# Patient Record
Sex: Female | Born: 1940 | Race: White | Hispanic: No | Smoking: Former smoker
Health system: Southern US, Community
[De-identification: ages and names within clinical notes are randomized; demographics above are authoritative.]

## PROBLEM LIST (undated history)

## (undated) DIAGNOSIS — R87629 Unspecified abnormal cytological findings in specimens from vagina: Secondary | ICD-10-CM

## (undated) DIAGNOSIS — F419 Anxiety disorder, unspecified: Secondary | ICD-10-CM

## (undated) DIAGNOSIS — I10 Essential (primary) hypertension: Secondary | ICD-10-CM

## (undated) DIAGNOSIS — F329 Major depressive disorder, single episode, unspecified: Secondary | ICD-10-CM

## (undated) DIAGNOSIS — IMO0002 Reserved for concepts with insufficient information to code with codable children: Secondary | ICD-10-CM

## (undated) DIAGNOSIS — N816 Rectocele: Secondary | ICD-10-CM

## (undated) DIAGNOSIS — E0590 Thyrotoxicosis, unspecified without thyrotoxic crisis or storm: Secondary | ICD-10-CM

## (undated) DIAGNOSIS — E7800 Pure hypercholesterolemia, unspecified: Secondary | ICD-10-CM

## (undated) DIAGNOSIS — E079 Disorder of thyroid, unspecified: Secondary | ICD-10-CM

## (undated) DIAGNOSIS — R87619 Unspecified abnormal cytological findings in specimens from cervix uteri: Secondary | ICD-10-CM

## (undated) DIAGNOSIS — N3941 Urge incontinence: Secondary | ICD-10-CM

## (undated) DIAGNOSIS — C801 Malignant (primary) neoplasm, unspecified: Secondary | ICD-10-CM

## (undated) DIAGNOSIS — F32A Depression, unspecified: Secondary | ICD-10-CM

## (undated) HISTORY — DX: Urge incontinence: N39.41

## (undated) HISTORY — DX: Major depressive disorder, single episode, unspecified: F32.9

## (undated) HISTORY — PX: ABDOMINAL HYSTERECTOMY: SHX81

## (undated) HISTORY — DX: Disorder of thyroid, unspecified: E07.9

## (undated) HISTORY — DX: Malignant (primary) neoplasm, unspecified: C80.1

## (undated) HISTORY — DX: Anxiety disorder, unspecified: F41.9

## (undated) HISTORY — DX: Reserved for concepts with insufficient information to code with codable children: IMO0002

## (undated) HISTORY — DX: Rectocele: N81.6

## (undated) HISTORY — DX: Thyrotoxicosis, unspecified without thyrotoxic crisis or storm: E05.90

## (undated) HISTORY — DX: Depression, unspecified: F32.A

## (undated) HISTORY — DX: Pure hypercholesterolemia, unspecified: E78.00

## (undated) HISTORY — DX: Essential (primary) hypertension: I10

## (undated) HISTORY — PX: EYE SURGERY: SHX253

## (undated) HISTORY — DX: Unspecified abnormal cytological findings in specimens from cervix uteri: R87.619

## (undated) HISTORY — PX: OTHER SURGICAL HISTORY: SHX169

## (undated) HISTORY — PX: BLADDER SURGERY: SHX569

## (undated) HISTORY — DX: Unspecified abnormal cytological findings in specimens from vagina: R87.629

---

## 2012-03-22 ENCOUNTER — Other Ambulatory Visit: Payer: Self-pay | Admitting: Internal Medicine

## 2012-03-22 DIAGNOSIS — Z1231 Encounter for screening mammogram for malignant neoplasm of breast: Secondary | ICD-10-CM

## 2012-04-13 ENCOUNTER — Ambulatory Visit: Payer: Self-pay

## 2012-11-12 ENCOUNTER — Ambulatory Visit (INDEPENDENT_AMBULATORY_CARE_PROVIDER_SITE_OTHER): Payer: Self-pay | Admitting: Adult Health

## 2012-11-12 ENCOUNTER — Encounter: Payer: Self-pay | Admitting: Adult Health

## 2012-11-12 VITALS — BP 112/70 | HR 72 | Ht 59.5 in | Wt 132.0 lb

## 2012-11-12 DIAGNOSIS — I10 Essential (primary) hypertension: Secondary | ICD-10-CM | POA: Insufficient documentation

## 2012-11-12 DIAGNOSIS — E7800 Pure hypercholesterolemia, unspecified: Secondary | ICD-10-CM

## 2012-11-12 DIAGNOSIS — E0590 Thyrotoxicosis, unspecified without thyrotoxic crisis or storm: Secondary | ICD-10-CM | POA: Insufficient documentation

## 2012-11-12 DIAGNOSIS — N3941 Urge incontinence: Secondary | ICD-10-CM

## 2012-11-12 DIAGNOSIS — Z01419 Encounter for gynecological examination (general) (routine) without abnormal findings: Secondary | ICD-10-CM

## 2012-11-12 DIAGNOSIS — Z1212 Encounter for screening for malignant neoplasm of rectum: Secondary | ICD-10-CM

## 2012-11-12 DIAGNOSIS — F419 Anxiety disorder, unspecified: Secondary | ICD-10-CM | POA: Insufficient documentation

## 2012-11-12 DIAGNOSIS — N816 Rectocele: Secondary | ICD-10-CM

## 2012-11-12 DIAGNOSIS — F411 Generalized anxiety disorder: Secondary | ICD-10-CM

## 2012-11-12 HISTORY — DX: Pure hypercholesterolemia, unspecified: E78.00

## 2012-11-12 HISTORY — DX: Rectocele: N81.6

## 2012-11-12 HISTORY — DX: Thyrotoxicosis, unspecified without thyrotoxic crisis or storm: E05.90

## 2012-11-12 HISTORY — DX: Urge incontinence: N39.41

## 2012-11-12 LAB — HEMOCCULT GUIAC POC 1CARD (OFFICE)

## 2012-11-12 MED ORDER — ESTRADIOL 0.1 MG/GM VA CREA: TOPICAL_CREAM | VAGINAL | Status: DC

## 2012-11-12 NOTE — Patient Instructions (Signed)
Use estace cream as needed, get mammogram, labs per DrMarland Kitchen Margo Aye, sign up for my chart.

## 2012-11-12 NOTE — Progress Notes (Signed)
Patient ID: Cindy Hopkins, female   DOB: [birth year], 72 y.o.   MRN: [id number] History of Present Illness:Cindy Hopkins is a 72 year old white female, married in today for Physical exam.    Current Medications, Allergies, Past Medical History, Past Surgical History, Family History and Social History were reviewed in [employer] record.     Review of Systems:Patient denies any headaches, blurred vision, shortness of breath, chest pain, abdominal pain, problems with bowel movements, urination, except occasional urge incontinence.She is not sexually active. She has some heartburn and uses OTC medication, she has had tests and x-rays in the past.she has aches in her knees and  Back, she says it is arthritis.She is anxious at times(she cares for her husband), and she is taking prozac.      Physical Exam: General:  Well developed, well nourished, no acute distress Skin:  Warm and dry Neck:  Midline trachea, normal thyroid, no carotid bruits heard. Lungs; Clear to auscultation bilaterally Breast:  No dominant palpable mass, retraction, or nipple discharge Cardiovascular: Regular rate and rhythm Abdomen:  Soft, non tender, no hepatosplenomegaly Pelvic:  External genitalia is normal in appearance for age.  The vagina is atrophic in appearance.  The cervix  and uterus are absent No adnexal masses or tenderness noted. Rectal: Good sphincter tone, no polyps, or hemorrhoids felt.  Hemoccult negative.Positive rectocele Extremities:  No swelling or varicosities noted Psych:  Anxious.   Impression:Yearly exam Hypertension Hyperthyroid Rectocele Urge incontinence     Plan:Mammogram advised, colonoscopy as per GI, Labs as per Dr. Margo Aye, Samples given of estrace cream Return to clinic in 1-2 years physical exam or prn problems.

## 2014-05-18 ENCOUNTER — Other Ambulatory Visit (HOSPITAL_COMMUNITY): Payer: Self-pay | Admitting: Internal Medicine

## 2014-05-18 DIAGNOSIS — Z139 Encounter for screening, unspecified: Secondary | ICD-10-CM

## 2014-05-24 ENCOUNTER — Ambulatory Visit (HOSPITAL_COMMUNITY)
Admission: RE | Admit: 2014-05-24 | Discharge: 2014-05-24 | Disposition: A | Discharge status: Home / Self Care | Payer: Medicare PPO | Source: Ambulatory Visit | Attending: Internal Medicine | Admitting: Internal Medicine

## 2014-05-24 DIAGNOSIS — Z1231 Encounter for screening mammogram for malignant neoplasm of breast: Secondary | ICD-10-CM | POA: Diagnosis not present

## 2014-05-24 DIAGNOSIS — Z139 Encounter for screening, unspecified: Secondary | ICD-10-CM

## 2014-06-19 ENCOUNTER — Encounter: Payer: Self-pay | Admitting: Adult Health

## 2014-10-04 DIAGNOSIS — E782 Mixed hyperlipidemia: Secondary | ICD-10-CM | POA: Diagnosis not present

## 2014-10-04 DIAGNOSIS — I10 Essential (primary) hypertension: Secondary | ICD-10-CM | POA: Diagnosis not present

## 2014-10-04 DIAGNOSIS — E039 Hypothyroidism, unspecified: Secondary | ICD-10-CM | POA: Diagnosis not present

## 2014-10-05 DIAGNOSIS — R42 Dizziness and giddiness: Secondary | ICD-10-CM | POA: Diagnosis not present

## 2014-10-05 DIAGNOSIS — F339 Major depressive disorder, recurrent, unspecified: Secondary | ICD-10-CM | POA: Diagnosis not present

## 2014-10-05 DIAGNOSIS — M791 Myalgia: Secondary | ICD-10-CM | POA: Diagnosis not present

## 2014-10-05 DIAGNOSIS — I10 Essential (primary) hypertension: Secondary | ICD-10-CM | POA: Diagnosis not present

## 2015-01-16 DIAGNOSIS — M79642 Pain in left hand: Secondary | ICD-10-CM | POA: Diagnosis not present

## 2015-01-16 DIAGNOSIS — G8929 Other chronic pain: Secondary | ICD-10-CM | POA: Diagnosis not present

## 2015-01-16 DIAGNOSIS — M1990 Unspecified osteoarthritis, unspecified site: Secondary | ICD-10-CM | POA: Diagnosis not present

## 2015-01-16 DIAGNOSIS — M25561 Pain in right knee: Secondary | ICD-10-CM | POA: Diagnosis not present

## 2015-01-16 DIAGNOSIS — M79641 Pain in right hand: Secondary | ICD-10-CM | POA: Diagnosis not present

## 2015-01-16 DIAGNOSIS — M25562 Pain in left knee: Secondary | ICD-10-CM | POA: Diagnosis not present

## 2015-01-31 DIAGNOSIS — M150 Primary generalized (osteo)arthritis: Secondary | ICD-10-CM | POA: Diagnosis not present

## 2015-01-31 DIAGNOSIS — M65341 Trigger finger, right ring finger: Secondary | ICD-10-CM | POA: Diagnosis not present

## 2015-04-03 DIAGNOSIS — I10 Essential (primary) hypertension: Secondary | ICD-10-CM | POA: Diagnosis not present

## 2015-04-03 DIAGNOSIS — E782 Mixed hyperlipidemia: Secondary | ICD-10-CM | POA: Diagnosis not present

## 2015-04-03 DIAGNOSIS — E039 Hypothyroidism, unspecified: Secondary | ICD-10-CM | POA: Diagnosis not present

## 2015-04-05 DIAGNOSIS — M79606 Pain in leg, unspecified: Secondary | ICD-10-CM | POA: Diagnosis not present

## 2015-04-05 DIAGNOSIS — E785 Hyperlipidemia, unspecified: Secondary | ICD-10-CM | POA: Diagnosis not present

## 2015-04-05 DIAGNOSIS — I519 Heart disease, unspecified: Secondary | ICD-10-CM | POA: Diagnosis not present

## 2015-04-05 DIAGNOSIS — K219 Gastro-esophageal reflux disease without esophagitis: Secondary | ICD-10-CM | POA: Diagnosis not present

## 2015-04-05 DIAGNOSIS — I10 Essential (primary) hypertension: Secondary | ICD-10-CM | POA: Diagnosis not present

## 2015-04-05 DIAGNOSIS — M6530 Trigger finger, unspecified finger: Secondary | ICD-10-CM | POA: Diagnosis not present

## 2015-04-16 ENCOUNTER — Other Ambulatory Visit (HOSPITAL_COMMUNITY): Payer: Self-pay | Admitting: Internal Medicine

## 2015-04-16 DIAGNOSIS — I25119 Atherosclerotic heart disease of native coronary artery with unspecified angina pectoris: Secondary | ICD-10-CM

## 2015-04-19 ENCOUNTER — Ambulatory Visit (HOSPITAL_COMMUNITY)
Admission: RE | Admit: 2015-04-19 | Discharge: 2015-04-19 | Disposition: A | Discharge status: Home / Self Care | Payer: Commercial Managed Care - HMO | Source: Ambulatory Visit | Attending: Internal Medicine | Admitting: Internal Medicine

## 2015-04-19 DIAGNOSIS — I2510 Atherosclerotic heart disease of native coronary artery without angina pectoris: Secondary | ICD-10-CM | POA: Diagnosis not present

## 2015-04-19 DIAGNOSIS — I6523 Occlusion and stenosis of bilateral carotid arteries: Secondary | ICD-10-CM | POA: Diagnosis not present

## 2015-04-19 DIAGNOSIS — I25119 Atherosclerotic heart disease of native coronary artery with unspecified angina pectoris: Secondary | ICD-10-CM

## 2015-06-25 DIAGNOSIS — H524 Presbyopia: Secondary | ICD-10-CM | POA: Diagnosis not present

## 2015-06-25 DIAGNOSIS — H2510 Age-related nuclear cataract, unspecified eye: Secondary | ICD-10-CM | POA: Diagnosis not present

## 2015-06-25 DIAGNOSIS — H5210 Myopia, unspecified eye: Secondary | ICD-10-CM | POA: Diagnosis not present

## 2015-07-25 ENCOUNTER — Other Ambulatory Visit (HOSPITAL_COMMUNITY): Payer: Self-pay | Admitting: Internal Medicine

## 2015-07-25 DIAGNOSIS — Z1231 Encounter for screening mammogram for malignant neoplasm of breast: Secondary | ICD-10-CM

## 2015-07-30 ENCOUNTER — Ambulatory Visit (HOSPITAL_COMMUNITY)
Admission: RE | Admit: 2015-07-30 | Discharge: 2015-07-30 | Disposition: A | Discharge status: Home / Self Care | Payer: Commercial Managed Care - HMO | Source: Ambulatory Visit | Attending: Internal Medicine | Admitting: Internal Medicine

## 2015-07-30 ENCOUNTER — Ambulatory Visit (HOSPITAL_COMMUNITY): Payer: Medicare PPO

## 2015-07-30 DIAGNOSIS — Z1231 Encounter for screening mammogram for malignant neoplasm of breast: Secondary | ICD-10-CM | POA: Diagnosis not present

## 2015-09-25 DIAGNOSIS — E039 Hypothyroidism, unspecified: Secondary | ICD-10-CM | POA: Diagnosis not present

## 2015-09-25 DIAGNOSIS — I519 Heart disease, unspecified: Secondary | ICD-10-CM | POA: Diagnosis not present

## 2015-09-25 DIAGNOSIS — E782 Mixed hyperlipidemia: Secondary | ICD-10-CM | POA: Diagnosis not present

## 2015-09-28 DIAGNOSIS — H02403 Unspecified ptosis of bilateral eyelids: Secondary | ICD-10-CM | POA: Diagnosis not present

## 2015-10-08 DIAGNOSIS — L570 Actinic keratosis: Secondary | ICD-10-CM | POA: Diagnosis not present

## 2015-10-08 DIAGNOSIS — Z08 Encounter for follow-up examination after completed treatment for malignant neoplasm: Secondary | ICD-10-CM | POA: Diagnosis not present

## 2015-10-08 DIAGNOSIS — D225 Melanocytic nevi of trunk: Secondary | ICD-10-CM | POA: Diagnosis not present

## 2015-10-08 DIAGNOSIS — X32XXXD Exposure to sunlight, subsequent encounter: Secondary | ICD-10-CM | POA: Diagnosis not present

## 2015-10-08 DIAGNOSIS — Z85828 Personal history of other malignant neoplasm of skin: Secondary | ICD-10-CM | POA: Diagnosis not present

## 2015-11-08 ENCOUNTER — Ambulatory Visit (INDEPENDENT_AMBULATORY_CARE_PROVIDER_SITE_OTHER): Payer: Commercial Managed Care - HMO | Admitting: Otolaryngology

## 2015-12-07 DIAGNOSIS — H02836 Dermatochalasis of left eye, unspecified eyelid: Secondary | ICD-10-CM | POA: Diagnosis not present

## 2015-12-07 DIAGNOSIS — H02401 Unspecified ptosis of right eyelid: Secondary | ICD-10-CM | POA: Diagnosis not present

## 2015-12-07 DIAGNOSIS — H02833 Dermatochalasis of right eye, unspecified eyelid: Secondary | ICD-10-CM | POA: Diagnosis not present

## 2015-12-07 DIAGNOSIS — H5702 Anisocoria: Secondary | ICD-10-CM | POA: Diagnosis not present

## 2015-12-27 ENCOUNTER — Ambulatory Visit (INDEPENDENT_AMBULATORY_CARE_PROVIDER_SITE_OTHER): Payer: Commercial Managed Care - HMO | Admitting: Otolaryngology

## 2015-12-27 DIAGNOSIS — H903 Sensorineural hearing loss, bilateral: Secondary | ICD-10-CM

## 2015-12-27 DIAGNOSIS — H6983 Other specified disorders of Eustachian tube, bilateral: Secondary | ICD-10-CM | POA: Diagnosis not present

## 2015-12-27 DIAGNOSIS — H6123 Impacted cerumen, bilateral: Secondary | ICD-10-CM | POA: Diagnosis not present

## 2016-01-01 DIAGNOSIS — G902 Horner's syndrome: Secondary | ICD-10-CM | POA: Diagnosis not present

## 2016-01-07 DIAGNOSIS — M4312 Spondylolisthesis, cervical region: Secondary | ICD-10-CM | POA: Diagnosis not present

## 2016-01-07 DIAGNOSIS — R590 Localized enlarged lymph nodes: Secondary | ICD-10-CM | POA: Diagnosis not present

## 2016-01-07 DIAGNOSIS — M50821 Other cervical disc disorders at C4-C5 level: Secondary | ICD-10-CM | POA: Diagnosis not present

## 2016-01-07 DIAGNOSIS — M50822 Other cervical disc disorders at C5-C6 level: Secondary | ICD-10-CM | POA: Diagnosis not present

## 2016-01-07 DIAGNOSIS — M2578 Osteophyte, vertebrae: Secondary | ICD-10-CM | POA: Diagnosis not present

## 2016-01-07 DIAGNOSIS — I771 Stricture of artery: Secondary | ICD-10-CM | POA: Diagnosis not present

## 2016-01-07 DIAGNOSIS — I6522 Occlusion and stenosis of left carotid artery: Secondary | ICD-10-CM | POA: Diagnosis not present

## 2016-01-07 DIAGNOSIS — G902 Horner's syndrome: Secondary | ICD-10-CM | POA: Diagnosis not present

## 2016-01-07 DIAGNOSIS — M47812 Spondylosis without myelopathy or radiculopathy, cervical region: Secondary | ICD-10-CM | POA: Diagnosis not present

## 2016-01-17 DIAGNOSIS — H02403 Unspecified ptosis of bilateral eyelids: Secondary | ICD-10-CM | POA: Diagnosis not present

## 2016-02-11 DIAGNOSIS — I10 Essential (primary) hypertension: Secondary | ICD-10-CM | POA: Diagnosis not present

## 2016-02-11 DIAGNOSIS — E7800 Pure hypercholesterolemia, unspecified: Secondary | ICD-10-CM | POA: Diagnosis not present

## 2016-02-11 DIAGNOSIS — M1990 Unspecified osteoarthritis, unspecified site: Secondary | ICD-10-CM | POA: Diagnosis not present

## 2016-02-11 DIAGNOSIS — H02834 Dermatochalasis of left upper eyelid: Secondary | ICD-10-CM | POA: Diagnosis not present

## 2016-02-11 DIAGNOSIS — F418 Other specified anxiety disorders: Secondary | ICD-10-CM | POA: Diagnosis not present

## 2016-02-11 DIAGNOSIS — H02401 Unspecified ptosis of right eyelid: Secondary | ICD-10-CM | POA: Diagnosis not present

## 2016-02-11 DIAGNOSIS — Z85828 Personal history of other malignant neoplasm of skin: Secondary | ICD-10-CM | POA: Diagnosis not present

## 2016-02-11 DIAGNOSIS — E039 Hypothyroidism, unspecified: Secondary | ICD-10-CM | POA: Diagnosis not present

## 2016-02-11 DIAGNOSIS — K219 Gastro-esophageal reflux disease without esophagitis: Secondary | ICD-10-CM | POA: Diagnosis not present

## 2016-02-11 DIAGNOSIS — H02831 Dermatochalasis of right upper eyelid: Secondary | ICD-10-CM | POA: Diagnosis not present

## 2016-04-01 DIAGNOSIS — E039 Hypothyroidism, unspecified: Secondary | ICD-10-CM | POA: Diagnosis not present

## 2016-04-01 DIAGNOSIS — E782 Mixed hyperlipidemia: Secondary | ICD-10-CM | POA: Diagnosis not present

## 2016-04-03 DIAGNOSIS — I10 Essential (primary) hypertension: Secondary | ICD-10-CM | POA: Diagnosis not present

## 2016-04-03 DIAGNOSIS — F339 Major depressive disorder, recurrent, unspecified: Secondary | ICD-10-CM | POA: Diagnosis not present

## 2016-04-03 DIAGNOSIS — K210 Gastro-esophageal reflux disease with esophagitis: Secondary | ICD-10-CM | POA: Diagnosis not present

## 2016-04-03 DIAGNOSIS — E785 Hyperlipidemia, unspecified: Secondary | ICD-10-CM | POA: Diagnosis not present

## 2016-04-03 DIAGNOSIS — H02403 Unspecified ptosis of bilateral eyelids: Secondary | ICD-10-CM | POA: Diagnosis not present

## 2016-04-03 DIAGNOSIS — I519 Heart disease, unspecified: Secondary | ICD-10-CM | POA: Diagnosis not present

## 2016-05-27 ENCOUNTER — Ambulatory Visit: Payer: Commercial Managed Care - HMO

## 2016-09-02 DIAGNOSIS — H524 Presbyopia: Secondary | ICD-10-CM | POA: Diagnosis not present

## 2016-09-02 DIAGNOSIS — Z0100 Encounter for examination of eyes and vision without abnormal findings: Secondary | ICD-10-CM | POA: Diagnosis not present

## 2016-09-24 DIAGNOSIS — E039 Hypothyroidism, unspecified: Secondary | ICD-10-CM | POA: Diagnosis not present

## 2016-09-24 DIAGNOSIS — E785 Hyperlipidemia, unspecified: Secondary | ICD-10-CM | POA: Diagnosis not present

## 2016-09-26 DIAGNOSIS — E785 Hyperlipidemia, unspecified: Secondary | ICD-10-CM | POA: Diagnosis not present

## 2016-09-26 DIAGNOSIS — N952 Postmenopausal atrophic vaginitis: Secondary | ICD-10-CM | POA: Diagnosis not present

## 2016-09-26 DIAGNOSIS — I10 Essential (primary) hypertension: Secondary | ICD-10-CM | POA: Diagnosis not present

## 2016-09-26 DIAGNOSIS — F5101 Primary insomnia: Secondary | ICD-10-CM | POA: Diagnosis not present

## 2016-09-26 DIAGNOSIS — F339 Major depressive disorder, recurrent, unspecified: Secondary | ICD-10-CM | POA: Diagnosis not present

## 2016-09-26 DIAGNOSIS — M79606 Pain in leg, unspecified: Secondary | ICD-10-CM | POA: Diagnosis not present

## 2017-01-01 ENCOUNTER — Ambulatory Visit (INDEPENDENT_AMBULATORY_CARE_PROVIDER_SITE_OTHER): Payer: Medicare HMO | Admitting: Otolaryngology

## 2017-01-01 DIAGNOSIS — H903 Sensorineural hearing loss, bilateral: Secondary | ICD-10-CM | POA: Diagnosis not present

## 2017-01-01 DIAGNOSIS — H6983 Other specified disorders of Eustachian tube, bilateral: Secondary | ICD-10-CM | POA: Diagnosis not present

## 2017-01-01 DIAGNOSIS — H6121 Impacted cerumen, right ear: Secondary | ICD-10-CM

## 2017-03-19 DIAGNOSIS — I10 Essential (primary) hypertension: Secondary | ICD-10-CM | POA: Diagnosis not present

## 2017-03-19 DIAGNOSIS — E039 Hypothyroidism, unspecified: Secondary | ICD-10-CM | POA: Diagnosis not present

## 2017-03-23 DIAGNOSIS — E785 Hyperlipidemia, unspecified: Secondary | ICD-10-CM | POA: Diagnosis not present

## 2017-03-23 DIAGNOSIS — I10 Essential (primary) hypertension: Secondary | ICD-10-CM | POA: Diagnosis not present

## 2017-03-23 DIAGNOSIS — I519 Heart disease, unspecified: Secondary | ICD-10-CM | POA: Diagnosis not present

## 2017-03-23 DIAGNOSIS — N952 Postmenopausal atrophic vaginitis: Secondary | ICD-10-CM | POA: Diagnosis not present

## 2017-03-23 DIAGNOSIS — G4700 Insomnia, unspecified: Secondary | ICD-10-CM | POA: Diagnosis not present

## 2017-03-23 DIAGNOSIS — M79606 Pain in leg, unspecified: Secondary | ICD-10-CM | POA: Diagnosis not present

## 2017-03-23 DIAGNOSIS — F339 Major depressive disorder, recurrent, unspecified: Secondary | ICD-10-CM | POA: Diagnosis not present

## 2017-03-23 DIAGNOSIS — K210 Gastro-esophageal reflux disease with esophagitis: Secondary | ICD-10-CM | POA: Diagnosis not present

## 2017-03-23 DIAGNOSIS — Z6825 Body mass index (BMI) 25.0-25.9, adult: Secondary | ICD-10-CM | POA: Diagnosis not present

## 2017-08-27 DIAGNOSIS — H524 Presbyopia: Secondary | ICD-10-CM | POA: Diagnosis not present

## 2017-08-27 DIAGNOSIS — H25013 Cortical age-related cataract, bilateral: Secondary | ICD-10-CM | POA: Diagnosis not present

## 2017-09-03 ENCOUNTER — Other Ambulatory Visit (HOSPITAL_COMMUNITY): Payer: Self-pay | Admitting: Internal Medicine

## 2017-09-03 DIAGNOSIS — Z1231 Encounter for screening mammogram for malignant neoplasm of breast: Secondary | ICD-10-CM

## 2017-09-07 ENCOUNTER — Ambulatory Visit (HOSPITAL_COMMUNITY): Payer: Medicare HMO

## 2017-09-14 ENCOUNTER — Ambulatory Visit (HOSPITAL_COMMUNITY)
Admission: RE | Admit: 2017-09-14 | Discharge: 2017-09-14 | Disposition: A | Discharge status: Home / Self Care | Payer: Medicare HMO | Source: Ambulatory Visit | Attending: Internal Medicine | Admitting: Internal Medicine

## 2017-09-14 DIAGNOSIS — Z1231 Encounter for screening mammogram for malignant neoplasm of breast: Secondary | ICD-10-CM | POA: Insufficient documentation

## 2017-09-21 ENCOUNTER — Encounter: Payer: Self-pay | Admitting: Adult Health

## 2017-09-21 ENCOUNTER — Ambulatory Visit: Payer: Medicare HMO | Admitting: Adult Health

## 2017-09-21 VITALS — BP 142/70 | HR 68 | Ht <= 58 in | Wt 136.5 lb

## 2017-09-21 DIAGNOSIS — N952 Postmenopausal atrophic vaginitis: Secondary | ICD-10-CM | POA: Diagnosis not present

## 2017-09-21 DIAGNOSIS — Z1211 Encounter for screening for malignant neoplasm of colon: Secondary | ICD-10-CM | POA: Diagnosis not present

## 2017-09-21 DIAGNOSIS — Z1212 Encounter for screening for malignant neoplasm of rectum: Secondary | ICD-10-CM | POA: Diagnosis not present

## 2017-09-21 DIAGNOSIS — Z01419 Encounter for gynecological examination (general) (routine) without abnormal findings: Secondary | ICD-10-CM | POA: Insufficient documentation

## 2017-09-21 DIAGNOSIS — Z01411 Encounter for gynecological examination (general) (routine) with abnormal findings: Secondary | ICD-10-CM

## 2017-09-21 LAB — HEMOCCULT GUIAC POC 1CARD (OFFICE): Fecal Occult Blood, POC: NEGATIVE

## 2017-09-21 MED ORDER — ESTRADIOL 0.1 MG/GM VA CREA: TOPICAL_CREAM | VAGINAL | 1 refills | Status: DC

## 2017-09-21 NOTE — Progress Notes (Signed)
Patient ID: Cindy Hopkins., female   DOB: 05-31-1941, 77 y.o.   MRN: [id number] History of Present Illness:  Cindy Hopkins is a 77 year old white female, widowed in for well woman gyn exam and requests refills on estrace vaginal cream.Her husband died in 06-04-17 and she has reconnected with old Bo. And she is planning to see him in May.  PCP is Dr Nevada Crane.   Current Medications, Allergies, Past Medical History, Past Surgical History, Family History and Social History were reviewed in [employer] record.     Review of Systems: Patient denies any headaches, hearing loss, fatigue, blurred vision, shortness of breath, chest pain, abdominal pain, problems with bowel movements(uses softener sometimes), urination, or intercourse(not having sex at present, but thinking about it). No joint pain or mood swings.    Physical Exam:BP (!) 142/70 (BP Location: Left Arm, Patient Position: Sitting, Cuff Size: Normal)   Pulse 68   Ht 4\' 10"  (1.473 m)   Wt 136 lb 8 oz (61.9 kg)   BMI 28.53 kg/m  General:  Well developed, well nourished, no acute distress Skin:  Warm and dry Neck:  Midline trachea, normal thyroid, good ROM, no lymphadenopathy,no carotid bruits heard Lungs; Clear to auscultation bilaterally Breast:  No dominant palpable mass, retraction, or nipple discharge Cardiovascular: Regular rate and rhythm Abdomen:  Soft, non tender, no hepatosplenomegaly Pelvic:  External genitalia is normal in appearance, no lesions.  The vagina pale, and has lost rugae and moisture. Urethra has no lesions or masses. The cervix and uterus are absent. No adnexal masses or tenderness noted.Bladder is non tender, no masses felt. Rectal: Good sphincter tone, no polyps, or hemorrhoids felt.  Hemoccult negative.+rectocele Extremities/musculoskeletal:  No swelling or varicosities noted, no clubbing or cyanosis Psych:  No mood changes, alert and cooperative,seems happy PHQ 2 score 0.  Impression: 1.  Encounter for well woman exam with routine gynecological exam   2. Screening for colorectal cancer   3. Vaginal atrophy       Plan: Meds ordered this encounter  Medications  . estradiol (ESTRACE) 0.1 MG/GM vaginal cream    Sig: Use  2x weekly 0.5gms in vagina    Dispense:  42.5 g    Refill:  1    Order Specific Question:   Supervising Provider    Answer:   Florian Buff [[id number]]  Physical in 1 year Mammogram yearly Labs with PCP Has appt with Dr Nevada Crane 09/25/17

## 2017-09-22 DIAGNOSIS — H2511 Age-related nuclear cataract, right eye: Secondary | ICD-10-CM | POA: Diagnosis not present

## 2017-09-22 DIAGNOSIS — H04123 Dry eye syndrome of bilateral lacrimal glands: Secondary | ICD-10-CM | POA: Diagnosis not present

## 2017-09-25 DIAGNOSIS — I10 Essential (primary) hypertension: Secondary | ICD-10-CM | POA: Diagnosis not present

## 2017-09-25 DIAGNOSIS — E785 Hyperlipidemia, unspecified: Secondary | ICD-10-CM | POA: Diagnosis not present

## 2017-09-25 DIAGNOSIS — E039 Hypothyroidism, unspecified: Secondary | ICD-10-CM | POA: Diagnosis not present

## 2017-09-28 DIAGNOSIS — N952 Postmenopausal atrophic vaginitis: Secondary | ICD-10-CM | POA: Diagnosis not present

## 2017-09-28 DIAGNOSIS — K219 Gastro-esophageal reflux disease without esophagitis: Secondary | ICD-10-CM | POA: Diagnosis not present

## 2017-09-28 DIAGNOSIS — F339 Major depressive disorder, recurrent, unspecified: Secondary | ICD-10-CM | POA: Diagnosis not present

## 2017-09-28 DIAGNOSIS — I10 Essential (primary) hypertension: Secondary | ICD-10-CM | POA: Diagnosis not present

## 2017-09-28 DIAGNOSIS — F5101 Primary insomnia: Secondary | ICD-10-CM | POA: Diagnosis not present

## 2017-09-28 DIAGNOSIS — M79606 Pain in leg, unspecified: Secondary | ICD-10-CM | POA: Diagnosis not present

## 2017-09-28 DIAGNOSIS — I2510 Atherosclerotic heart disease of native coronary artery without angina pectoris: Secondary | ICD-10-CM | POA: Diagnosis not present

## 2017-09-28 DIAGNOSIS — E039 Hypothyroidism, unspecified: Secondary | ICD-10-CM | POA: Diagnosis not present

## 2017-09-28 DIAGNOSIS — E785 Hyperlipidemia, unspecified: Secondary | ICD-10-CM | POA: Diagnosis not present

## 2017-09-29 ENCOUNTER — Other Ambulatory Visit: Payer: Self-pay | Admitting: Internal Medicine

## 2017-09-29 ENCOUNTER — Other Ambulatory Visit (HOSPITAL_COMMUNITY): Payer: Self-pay | Admitting: Internal Medicine

## 2017-09-29 DIAGNOSIS — I779 Disorder of arteries and arterioles, unspecified: Secondary | ICD-10-CM

## 2017-09-29 DIAGNOSIS — I739 Peripheral vascular disease, unspecified: Principal | ICD-10-CM

## 2017-10-02 ENCOUNTER — Ambulatory Visit (HOSPITAL_COMMUNITY)
Admission: RE | Admit: 2017-10-02 | Discharge: 2017-10-02 | Disposition: A | Discharge status: Home / Self Care | Payer: Medicare HMO | Source: Ambulatory Visit | Attending: Internal Medicine | Admitting: Internal Medicine

## 2017-10-02 DIAGNOSIS — I6523 Occlusion and stenosis of bilateral carotid arteries: Secondary | ICD-10-CM | POA: Diagnosis not present

## 2017-10-02 DIAGNOSIS — I779 Disorder of arteries and arterioles, unspecified: Secondary | ICD-10-CM | POA: Diagnosis present

## 2017-10-02 DIAGNOSIS — I739 Peripheral vascular disease, unspecified: Secondary | ICD-10-CM

## 2017-10-05 DIAGNOSIS — F329 Major depressive disorder, single episode, unspecified: Secondary | ICD-10-CM | POA: Diagnosis not present

## 2017-10-05 DIAGNOSIS — I10 Essential (primary) hypertension: Secondary | ICD-10-CM | POA: Diagnosis not present

## 2017-10-05 DIAGNOSIS — K219 Gastro-esophageal reflux disease without esophagitis: Secondary | ICD-10-CM | POA: Diagnosis not present

## 2017-10-05 DIAGNOSIS — F5101 Primary insomnia: Secondary | ICD-10-CM | POA: Diagnosis not present

## 2017-10-05 DIAGNOSIS — H2511 Age-related nuclear cataract, right eye: Secondary | ICD-10-CM | POA: Diagnosis not present

## 2017-10-05 DIAGNOSIS — H04123 Dry eye syndrome of bilateral lacrimal glands: Secondary | ICD-10-CM | POA: Diagnosis not present

## 2017-10-05 DIAGNOSIS — F419 Anxiety disorder, unspecified: Secondary | ICD-10-CM | POA: Diagnosis not present

## 2017-10-05 DIAGNOSIS — E0590 Thyrotoxicosis, unspecified without thyrotoxic crisis or storm: Secondary | ICD-10-CM | POA: Diagnosis not present

## 2017-10-05 DIAGNOSIS — E785 Hyperlipidemia, unspecified: Secondary | ICD-10-CM | POA: Diagnosis not present

## 2017-10-20 DIAGNOSIS — H2512 Age-related nuclear cataract, left eye: Secondary | ICD-10-CM | POA: Diagnosis not present

## 2017-10-26 DIAGNOSIS — K210 Gastro-esophageal reflux disease with esophagitis: Secondary | ICD-10-CM | POA: Diagnosis not present

## 2017-10-26 DIAGNOSIS — F5101 Primary insomnia: Secondary | ICD-10-CM | POA: Diagnosis not present

## 2017-10-26 DIAGNOSIS — N952 Postmenopausal atrophic vaginitis: Secondary | ICD-10-CM | POA: Diagnosis not present

## 2017-10-26 DIAGNOSIS — H02403 Unspecified ptosis of bilateral eyelids: Secondary | ICD-10-CM | POA: Diagnosis not present

## 2017-10-26 DIAGNOSIS — E785 Hyperlipidemia, unspecified: Secondary | ICD-10-CM | POA: Diagnosis not present

## 2017-10-26 DIAGNOSIS — I519 Heart disease, unspecified: Secondary | ICD-10-CM | POA: Diagnosis not present

## 2017-10-26 DIAGNOSIS — M1990 Unspecified osteoarthritis, unspecified site: Secondary | ICD-10-CM | POA: Diagnosis not present

## 2017-10-26 DIAGNOSIS — M5430 Sciatica, unspecified side: Secondary | ICD-10-CM | POA: Diagnosis not present

## 2017-10-26 DIAGNOSIS — I10 Essential (primary) hypertension: Secondary | ICD-10-CM | POA: Diagnosis not present

## 2017-10-29 DIAGNOSIS — M9905 Segmental and somatic dysfunction of pelvic region: Secondary | ICD-10-CM | POA: Diagnosis not present

## 2017-10-29 DIAGNOSIS — M5442 Lumbago with sciatica, left side: Secondary | ICD-10-CM | POA: Diagnosis not present

## 2017-10-29 DIAGNOSIS — M9902 Segmental and somatic dysfunction of thoracic region: Secondary | ICD-10-CM | POA: Diagnosis not present

## 2017-10-29 DIAGNOSIS — M9903 Segmental and somatic dysfunction of lumbar region: Secondary | ICD-10-CM | POA: Diagnosis not present

## 2017-11-02 DIAGNOSIS — H2512 Age-related nuclear cataract, left eye: Secondary | ICD-10-CM | POA: Diagnosis not present

## 2017-11-02 DIAGNOSIS — I10 Essential (primary) hypertension: Secondary | ICD-10-CM | POA: Diagnosis not present

## 2017-11-02 DIAGNOSIS — E785 Hyperlipidemia, unspecified: Secondary | ICD-10-CM | POA: Diagnosis not present

## 2017-11-02 DIAGNOSIS — M1990 Unspecified osteoarthritis, unspecified site: Secondary | ICD-10-CM | POA: Diagnosis not present

## 2017-11-02 DIAGNOSIS — F5101 Primary insomnia: Secondary | ICD-10-CM | POA: Diagnosis not present

## 2017-11-02 DIAGNOSIS — M5430 Sciatica, unspecified side: Secondary | ICD-10-CM | POA: Diagnosis not present

## 2017-11-02 DIAGNOSIS — Z961 Presence of intraocular lens: Secondary | ICD-10-CM | POA: Diagnosis not present

## 2017-11-02 DIAGNOSIS — K219 Gastro-esophageal reflux disease without esophagitis: Secondary | ICD-10-CM | POA: Diagnosis not present

## 2017-11-02 DIAGNOSIS — Z9841 Cataract extraction status, right eye: Secondary | ICD-10-CM | POA: Diagnosis not present

## 2017-11-04 DIAGNOSIS — M9903 Segmental and somatic dysfunction of lumbar region: Secondary | ICD-10-CM | POA: Diagnosis not present

## 2017-11-04 DIAGNOSIS — M9902 Segmental and somatic dysfunction of thoracic region: Secondary | ICD-10-CM | POA: Diagnosis not present

## 2017-11-04 DIAGNOSIS — M9905 Segmental and somatic dysfunction of pelvic region: Secondary | ICD-10-CM | POA: Diagnosis not present

## 2017-11-04 DIAGNOSIS — M5442 Lumbago with sciatica, left side: Secondary | ICD-10-CM | POA: Diagnosis not present

## 2017-11-06 DIAGNOSIS — M5442 Lumbago with sciatica, left side: Secondary | ICD-10-CM | POA: Diagnosis not present

## 2017-11-06 DIAGNOSIS — M9902 Segmental and somatic dysfunction of thoracic region: Secondary | ICD-10-CM | POA: Diagnosis not present

## 2017-11-06 DIAGNOSIS — M9905 Segmental and somatic dysfunction of pelvic region: Secondary | ICD-10-CM | POA: Diagnosis not present

## 2017-11-06 DIAGNOSIS — M9903 Segmental and somatic dysfunction of lumbar region: Secondary | ICD-10-CM | POA: Diagnosis not present

## 2017-11-09 DIAGNOSIS — M9902 Segmental and somatic dysfunction of thoracic region: Secondary | ICD-10-CM | POA: Diagnosis not present

## 2017-11-09 DIAGNOSIS — M9905 Segmental and somatic dysfunction of pelvic region: Secondary | ICD-10-CM | POA: Diagnosis not present

## 2017-11-09 DIAGNOSIS — M5442 Lumbago with sciatica, left side: Secondary | ICD-10-CM | POA: Diagnosis not present

## 2017-11-09 DIAGNOSIS — M9903 Segmental and somatic dysfunction of lumbar region: Secondary | ICD-10-CM | POA: Diagnosis not present

## 2017-11-10 DIAGNOSIS — M9903 Segmental and somatic dysfunction of lumbar region: Secondary | ICD-10-CM | POA: Diagnosis not present

## 2017-11-10 DIAGNOSIS — M9905 Segmental and somatic dysfunction of pelvic region: Secondary | ICD-10-CM | POA: Diagnosis not present

## 2017-11-10 DIAGNOSIS — M9902 Segmental and somatic dysfunction of thoracic region: Secondary | ICD-10-CM | POA: Diagnosis not present

## 2017-11-10 DIAGNOSIS — M5442 Lumbago with sciatica, left side: Secondary | ICD-10-CM | POA: Diagnosis not present

## 2017-11-12 DIAGNOSIS — M5442 Lumbago with sciatica, left side: Secondary | ICD-10-CM | POA: Diagnosis not present

## 2017-11-12 DIAGNOSIS — M9902 Segmental and somatic dysfunction of thoracic region: Secondary | ICD-10-CM | POA: Diagnosis not present

## 2017-11-12 DIAGNOSIS — M9905 Segmental and somatic dysfunction of pelvic region: Secondary | ICD-10-CM | POA: Diagnosis not present

## 2017-11-12 DIAGNOSIS — M9903 Segmental and somatic dysfunction of lumbar region: Secondary | ICD-10-CM | POA: Diagnosis not present

## 2017-11-17 DIAGNOSIS — M5442 Lumbago with sciatica, left side: Secondary | ICD-10-CM | POA: Diagnosis not present

## 2017-11-17 DIAGNOSIS — M9902 Segmental and somatic dysfunction of thoracic region: Secondary | ICD-10-CM | POA: Diagnosis not present

## 2017-11-17 DIAGNOSIS — M9903 Segmental and somatic dysfunction of lumbar region: Secondary | ICD-10-CM | POA: Diagnosis not present

## 2017-11-17 DIAGNOSIS — M9905 Segmental and somatic dysfunction of pelvic region: Secondary | ICD-10-CM | POA: Diagnosis not present

## 2017-11-19 DIAGNOSIS — M9902 Segmental and somatic dysfunction of thoracic region: Secondary | ICD-10-CM | POA: Diagnosis not present

## 2017-11-19 DIAGNOSIS — M5442 Lumbago with sciatica, left side: Secondary | ICD-10-CM | POA: Diagnosis not present

## 2017-11-19 DIAGNOSIS — M9905 Segmental and somatic dysfunction of pelvic region: Secondary | ICD-10-CM | POA: Diagnosis not present

## 2017-11-19 DIAGNOSIS — M9903 Segmental and somatic dysfunction of lumbar region: Secondary | ICD-10-CM | POA: Diagnosis not present

## 2017-11-24 DIAGNOSIS — M9903 Segmental and somatic dysfunction of lumbar region: Secondary | ICD-10-CM | POA: Diagnosis not present

## 2017-11-24 DIAGNOSIS — M5442 Lumbago with sciatica, left side: Secondary | ICD-10-CM | POA: Diagnosis not present

## 2017-11-24 DIAGNOSIS — M9905 Segmental and somatic dysfunction of pelvic region: Secondary | ICD-10-CM | POA: Diagnosis not present

## 2017-11-24 DIAGNOSIS — M9902 Segmental and somatic dysfunction of thoracic region: Secondary | ICD-10-CM | POA: Diagnosis not present

## 2017-11-25 DIAGNOSIS — Z0100 Encounter for examination of eyes and vision without abnormal findings: Secondary | ICD-10-CM | POA: Diagnosis not present

## 2017-11-26 DIAGNOSIS — M9903 Segmental and somatic dysfunction of lumbar region: Secondary | ICD-10-CM | POA: Diagnosis not present

## 2017-11-26 DIAGNOSIS — M9905 Segmental and somatic dysfunction of pelvic region: Secondary | ICD-10-CM | POA: Diagnosis not present

## 2017-11-26 DIAGNOSIS — M5442 Lumbago with sciatica, left side: Secondary | ICD-10-CM | POA: Diagnosis not present

## 2017-11-26 DIAGNOSIS — M9902 Segmental and somatic dysfunction of thoracic region: Secondary | ICD-10-CM | POA: Diagnosis not present

## 2017-12-07 DIAGNOSIS — M5442 Lumbago with sciatica, left side: Secondary | ICD-10-CM | POA: Diagnosis not present

## 2017-12-07 DIAGNOSIS — M9902 Segmental and somatic dysfunction of thoracic region: Secondary | ICD-10-CM | POA: Diagnosis not present

## 2017-12-07 DIAGNOSIS — M9903 Segmental and somatic dysfunction of lumbar region: Secondary | ICD-10-CM | POA: Diagnosis not present

## 2017-12-07 DIAGNOSIS — M9905 Segmental and somatic dysfunction of pelvic region: Secondary | ICD-10-CM | POA: Diagnosis not present

## 2017-12-31 ENCOUNTER — Ambulatory Visit (INDEPENDENT_AMBULATORY_CARE_PROVIDER_SITE_OTHER): Payer: Medicare HMO | Admitting: Otolaryngology

## 2018-01-07 ENCOUNTER — Ambulatory Visit (INDEPENDENT_AMBULATORY_CARE_PROVIDER_SITE_OTHER): Payer: Medicare HMO | Admitting: Otolaryngology

## 2018-01-07 DIAGNOSIS — H9313 Tinnitus, bilateral: Secondary | ICD-10-CM

## 2018-01-07 DIAGNOSIS — H6121 Impacted cerumen, right ear: Secondary | ICD-10-CM

## 2018-01-07 DIAGNOSIS — R42 Dizziness and giddiness: Secondary | ICD-10-CM | POA: Diagnosis not present

## 2018-01-07 DIAGNOSIS — H903 Sensorineural hearing loss, bilateral: Secondary | ICD-10-CM

## 2018-01-14 ENCOUNTER — Ambulatory Visit (INDEPENDENT_AMBULATORY_CARE_PROVIDER_SITE_OTHER): Payer: Medicare HMO | Admitting: Otolaryngology

## 2018-01-14 DIAGNOSIS — H6121 Impacted cerumen, right ear: Secondary | ICD-10-CM | POA: Diagnosis not present

## 2018-03-30 DIAGNOSIS — E785 Hyperlipidemia, unspecified: Secondary | ICD-10-CM | POA: Diagnosis not present

## 2018-03-30 DIAGNOSIS — H02403 Unspecified ptosis of bilateral eyelids: Secondary | ICD-10-CM | POA: Diagnosis not present

## 2018-03-30 DIAGNOSIS — R42 Dizziness and giddiness: Secondary | ICD-10-CM | POA: Diagnosis not present

## 2018-03-30 DIAGNOSIS — Z9181 History of falling: Secondary | ICD-10-CM | POA: Diagnosis not present

## 2018-03-30 DIAGNOSIS — K219 Gastro-esophageal reflux disease without esophagitis: Secondary | ICD-10-CM | POA: Diagnosis not present

## 2018-03-30 DIAGNOSIS — I519 Heart disease, unspecified: Secondary | ICD-10-CM | POA: Diagnosis not present

## 2018-03-30 DIAGNOSIS — F5101 Primary insomnia: Secondary | ICD-10-CM | POA: Diagnosis not present

## 2018-03-30 DIAGNOSIS — K210 Gastro-esophageal reflux disease with esophagitis: Secondary | ICD-10-CM | POA: Diagnosis not present

## 2018-03-30 DIAGNOSIS — N952 Postmenopausal atrophic vaginitis: Secondary | ICD-10-CM | POA: Diagnosis not present

## 2018-04-02 ENCOUNTER — Other Ambulatory Visit: Payer: Self-pay | Admitting: Internal Medicine

## 2018-04-02 DIAGNOSIS — H6120 Impacted cerumen, unspecified ear: Secondary | ICD-10-CM | POA: Diagnosis not present

## 2018-04-02 DIAGNOSIS — N952 Postmenopausal atrophic vaginitis: Secondary | ICD-10-CM | POA: Diagnosis not present

## 2018-04-02 DIAGNOSIS — M79606 Pain in leg, unspecified: Secondary | ICD-10-CM | POA: Diagnosis not present

## 2018-04-02 DIAGNOSIS — K219 Gastro-esophageal reflux disease without esophagitis: Secondary | ICD-10-CM | POA: Diagnosis not present

## 2018-04-02 DIAGNOSIS — E0590 Thyrotoxicosis, unspecified without thyrotoxic crisis or storm: Secondary | ICD-10-CM | POA: Diagnosis not present

## 2018-04-02 DIAGNOSIS — I10 Essential (primary) hypertension: Secondary | ICD-10-CM | POA: Diagnosis not present

## 2018-04-02 DIAGNOSIS — I6529 Occlusion and stenosis of unspecified carotid artery: Secondary | ICD-10-CM | POA: Diagnosis not present

## 2018-04-02 DIAGNOSIS — F5101 Primary insomnia: Secondary | ICD-10-CM | POA: Diagnosis not present

## 2018-04-02 DIAGNOSIS — Z780 Asymptomatic menopausal state: Secondary | ICD-10-CM

## 2018-04-02 DIAGNOSIS — F339 Major depressive disorder, recurrent, unspecified: Secondary | ICD-10-CM | POA: Diagnosis not present

## 2018-04-08 DIAGNOSIS — M791 Myalgia, unspecified site: Secondary | ICD-10-CM | POA: Diagnosis not present

## 2018-04-08 DIAGNOSIS — I519 Heart disease, unspecified: Secondary | ICD-10-CM | POA: Diagnosis not present

## 2018-04-08 DIAGNOSIS — F339 Major depressive disorder, recurrent, unspecified: Secondary | ICD-10-CM | POA: Diagnosis not present

## 2018-04-08 DIAGNOSIS — K210 Gastro-esophageal reflux disease with esophagitis: Secondary | ICD-10-CM | POA: Diagnosis not present

## 2018-04-08 DIAGNOSIS — I10 Essential (primary) hypertension: Secondary | ICD-10-CM | POA: Diagnosis not present

## 2018-04-08 DIAGNOSIS — F5101 Primary insomnia: Secondary | ICD-10-CM | POA: Diagnosis not present

## 2018-04-08 DIAGNOSIS — E785 Hyperlipidemia, unspecified: Secondary | ICD-10-CM | POA: Diagnosis not present

## 2018-04-08 DIAGNOSIS — E039 Hypothyroidism, unspecified: Secondary | ICD-10-CM | POA: Diagnosis not present

## 2018-04-08 DIAGNOSIS — K219 Gastro-esophageal reflux disease without esophagitis: Secondary | ICD-10-CM | POA: Diagnosis not present

## 2018-05-03 ENCOUNTER — Ambulatory Visit (HOSPITAL_COMMUNITY)
Admission: RE | Admit: 2018-05-03 | Discharge: 2018-05-03 | Disposition: A | Discharge status: Home / Self Care | Payer: Medicare HMO | Source: Ambulatory Visit | Attending: Internal Medicine | Admitting: Internal Medicine

## 2018-05-03 DIAGNOSIS — Z780 Asymptomatic menopausal state: Secondary | ICD-10-CM | POA: Diagnosis not present

## 2018-05-03 DIAGNOSIS — M85851 Other specified disorders of bone density and structure, right thigh: Secondary | ICD-10-CM | POA: Diagnosis not present

## 2018-05-03 DIAGNOSIS — Z23 Encounter for immunization: Secondary | ICD-10-CM | POA: Diagnosis not present

## 2018-05-03 DIAGNOSIS — Z13820 Encounter for screening for osteoporosis: Secondary | ICD-10-CM | POA: Diagnosis not present

## 2018-05-14 DIAGNOSIS — E785 Hyperlipidemia, unspecified: Secondary | ICD-10-CM | POA: Diagnosis not present

## 2018-05-14 DIAGNOSIS — I2510 Atherosclerotic heart disease of native coronary artery without angina pectoris: Secondary | ICD-10-CM | POA: Diagnosis not present

## 2018-05-14 DIAGNOSIS — F339 Major depressive disorder, recurrent, unspecified: Secondary | ICD-10-CM | POA: Diagnosis not present

## 2018-05-14 DIAGNOSIS — K219 Gastro-esophageal reflux disease without esophagitis: Secondary | ICD-10-CM | POA: Diagnosis not present

## 2018-05-14 DIAGNOSIS — I519 Heart disease, unspecified: Secondary | ICD-10-CM | POA: Diagnosis not present

## 2018-05-14 DIAGNOSIS — E039 Hypothyroidism, unspecified: Secondary | ICD-10-CM | POA: Diagnosis not present

## 2018-05-14 DIAGNOSIS — F5101 Primary insomnia: Secondary | ICD-10-CM | POA: Diagnosis not present

## 2018-05-14 DIAGNOSIS — I10 Essential (primary) hypertension: Secondary | ICD-10-CM | POA: Diagnosis not present

## 2018-05-14 DIAGNOSIS — M1990 Unspecified osteoarthritis, unspecified site: Secondary | ICD-10-CM | POA: Diagnosis not present

## 2018-08-26 ENCOUNTER — Other Ambulatory Visit (INDEPENDENT_AMBULATORY_CARE_PROVIDER_SITE_OTHER): Payer: Self-pay | Admitting: Otolaryngology

## 2018-08-30 DIAGNOSIS — H524 Presbyopia: Secondary | ICD-10-CM | POA: Diagnosis not present

## 2018-10-07 DIAGNOSIS — R42 Dizziness and giddiness: Secondary | ICD-10-CM | POA: Diagnosis not present

## 2018-10-07 DIAGNOSIS — M791 Myalgia, unspecified site: Secondary | ICD-10-CM | POA: Diagnosis not present

## 2018-10-07 DIAGNOSIS — Z9181 History of falling: Secondary | ICD-10-CM | POA: Diagnosis not present

## 2018-10-07 DIAGNOSIS — K219 Gastro-esophageal reflux disease without esophagitis: Secondary | ICD-10-CM | POA: Diagnosis not present

## 2018-10-07 DIAGNOSIS — F339 Major depressive disorder, recurrent, unspecified: Secondary | ICD-10-CM | POA: Diagnosis not present

## 2018-10-07 DIAGNOSIS — I10 Essential (primary) hypertension: Secondary | ICD-10-CM | POA: Diagnosis not present

## 2018-10-07 DIAGNOSIS — H02403 Unspecified ptosis of bilateral eyelids: Secondary | ICD-10-CM | POA: Diagnosis not present

## 2018-10-07 DIAGNOSIS — E785 Hyperlipidemia, unspecified: Secondary | ICD-10-CM | POA: Diagnosis not present

## 2018-10-07 DIAGNOSIS — E039 Hypothyroidism, unspecified: Secondary | ICD-10-CM | POA: Diagnosis not present

## 2018-10-12 DIAGNOSIS — M79606 Pain in leg, unspecified: Secondary | ICD-10-CM | POA: Diagnosis not present

## 2018-10-12 DIAGNOSIS — F39 Unspecified mood [affective] disorder: Secondary | ICD-10-CM | POA: Diagnosis not present

## 2018-10-12 DIAGNOSIS — F5101 Primary insomnia: Secondary | ICD-10-CM | POA: Diagnosis not present

## 2018-10-12 DIAGNOSIS — M25569 Pain in unspecified knee: Secondary | ICD-10-CM | POA: Diagnosis not present

## 2018-10-12 DIAGNOSIS — I6529 Occlusion and stenosis of unspecified carotid artery: Secondary | ICD-10-CM | POA: Diagnosis not present

## 2018-10-12 DIAGNOSIS — K219 Gastro-esophageal reflux disease without esophagitis: Secondary | ICD-10-CM | POA: Diagnosis not present

## 2018-10-12 DIAGNOSIS — I10 Essential (primary) hypertension: Secondary | ICD-10-CM | POA: Diagnosis not present

## 2018-10-12 DIAGNOSIS — E782 Mixed hyperlipidemia: Secondary | ICD-10-CM | POA: Diagnosis not present

## 2018-10-12 DIAGNOSIS — E039 Hypothyroidism, unspecified: Secondary | ICD-10-CM | POA: Diagnosis not present

## 2018-10-20 ENCOUNTER — Encounter: Payer: Self-pay | Admitting: Orthopaedic Surgery

## 2018-10-20 ENCOUNTER — Ambulatory Visit (INDEPENDENT_AMBULATORY_CARE_PROVIDER_SITE_OTHER): Payer: Medicare HMO

## 2018-10-20 ENCOUNTER — Ambulatory Visit: Payer: Medicare HMO | Admitting: Orthopaedic Surgery

## 2018-10-20 VITALS — BP 164/74 | HR 62 | Ht 58.5 in | Wt 136.0 lb

## 2018-10-20 DIAGNOSIS — G8929 Other chronic pain: Secondary | ICD-10-CM | POA: Diagnosis not present

## 2018-10-20 DIAGNOSIS — M25562 Pain in left knee: Secondary | ICD-10-CM

## 2018-10-20 DIAGNOSIS — M25561 Pain in right knee: Secondary | ICD-10-CM

## 2018-10-20 NOTE — Progress Notes (Signed)
Subjective:    Patient ID: Cindy Life., female    DOB: Jul 30, 1941, 78 y.o.   MRN: [id number]  HPI She has bilateral knee pain, more on the right getting worse over the last year or so.  She has popping and swelling. She has more pain with increased activity. She has no redness or giving way.  She has no numbness.  She says she has more pain with activity.  She has taken Advil with little help. She has been on Mobic in the past with little help.  She has seen Dr. Nevada Crane for this recently.   Review of Systems  Constitutional: Positive for activity change.  Musculoskeletal: Positive for arthralgias, gait problem and joint swelling.  Psychiatric/Behavioral: The patient is nervous/anxious.   All other systems reviewed and are negative.  For Review of Systems, all other systems reviewed and are negative.  The following is a summary of the past history medically, past history surgically, known current medicines, social history and family history.  This information is gathered electronically by the computer from prior information and documentation.  I review this each visit and have found including this information at this point in the chart is beneficial and informative.   Past Medical History:  Diagnosis Date  . Abnormal Pap smear   . Anxiety   . Cancer ([place])    skin  . Depression   . Hypercholesteremia 11/12/2012  . Hypertension   . Hyperthyroidism 11/12/2012  . Rectocele 11/12/2012  . Thyroid disease   . Urge incontinence 11/12/2012  . Vaginal Pap smear, abnormal     Past Surgical History:  Procedure Laterality Date  . ABDOMINAL HYSTERECTOMY    . bladder and bowel     mesh put in  . BLADDER SURGERY     sling x2  . EYE SURGERY      Current Outpatient Medications on File Prior to Visit  Medication Sig Dispense Refill  . aspirin 81 MG tablet Take 81 mg by mouth daily.    Marland Kitchen atenolol (TENORMIN) 50 MG tablet Take 50 mg by mouth 2 (two) times daily.    Marland Kitchen escitalopram (LEXAPRO) 20  MG tablet every morning before breakfast.    . estradiol (ESTRACE) 0.1 MG/GM vaginal cream Use  2x weekly 0.5gms in vagina 42.5 g 1  . fluticasone (FLONASE) 50 MCG/ACT nasal spray SPRAY 2 SPRAYS INTO EACH NOSTRIL TWICE DAILY FOR 7 DAYS; THEN USE ONCE DAILY AS NEEDED. 16 g PRN  . lovastatin (MEVACOR) 10 MG tablet Take 20 mg by mouth at bedtime.     . meloxicam (MOBIC) 15 MG tablet Take 15 mg by mouth daily.     . methimazole (TAPAZOLE) 5 MG tablet Take 5 mg by mouth daily. One daily on Monday Wednesday and Fridays and two on Saturdays    . pantoprazole (PROTONIX) 40 MG tablet Take 40 mg by mouth daily.     Marland Kitchen zolpidem (AMBIEN) 10 MG tablet Take 10 mg by mouth at bedtime as needed for sleep.     No current facility-administered medications on file prior to visit.     Social History   Socioeconomic History  . Marital status: Widowed    Spouse name: Not on file  . Number of children: Not on file  . Years of education: Not on file  . Highest education level: Not on file  Occupational History  . Not on file  Social Needs  . Financial resource strain: Not on file  . Food insecurity:  Worry: Not on file    Inability: Not on file  . Transportation needs:    Medical: Not on file    Non-medical: Not on file  Tobacco Use  . Smoking status: Former Smoker    Types: Cigarettes    Last attempt to quit: 08/18/1974    Years since quitting: 44.2  . Smokeless tobacco: Never Used  Substance and Sexual Activity  . Alcohol use: Yes    Comment: wine rarely  . Drug use: No  . Sexual activity: Never    Birth control/protection: Surgical    Comment: hyst  Lifestyle  . Physical activity:    Days per week: Not on file    Minutes per session: Not on file  . Stress: Not on file  Relationships  . Social connections:    Talks on phone: Not on file    Gets together: Not on file    Attends religious service: Not on file    Active member of club or organization: Not on file    Attends meetings of  clubs or organizations: Not on file    Relationship status: Not on file  . Intimate partner violence:    Fear of current or ex partner: Not on file    Emotionally abused: Not on file    Physically abused: Not on file    Forced sexual activity: Not on file  Other Topics Concern  . Not on file  Social History Narrative  . Not on file    Family History  Problem Relation Age of Onset  . Hypertension Mother   . Coronary artery disease Mother   . Diabetes Brother   . Cancer Paternal Grandmother        breast  . Diabetes Maternal Grandfather   . COPD Father   . Coronary artery disease Brother   . Diabetes Maternal Uncle     BP (!) 164/74   Pulse 62   Ht 4' 10.5" (1.486 m)   Wt 136 lb (61.7 kg)   BMI 27.94 kg/m   Body mass index is 27.94 kg/m.     Objective:   Physical Exam Constitutional:      Appearance: She is well-developed.  HENT:     Head: Normocephalic and atraumatic.  Eyes:     Conjunctiva/sclera: Conjunctivae normal.     Pupils: Pupils are equal, round, and reactive to light.  Neck:     Musculoskeletal: Normal range of motion and neck supple.  Cardiovascular:     Rate and Rhythm: Normal rate and regular rhythm.  Pulmonary:     Effort: Pulmonary effort is normal.  Abdominal:     Palpations: Abdomen is soft.  Musculoskeletal:     Right knee: She exhibits decreased range of motion. Tenderness found. Medial joint line tenderness noted.     Left knee: She exhibits decreased range of motion and swelling. Tenderness found. Medial joint line tenderness noted.       Legs:  Skin:    General: Skin is warm and dry.  Neurological:     Mental Status: She is alert and oriented to person, place, and time.     Cranial Nerves: No cranial nerve deficit.     Motor: No abnormal muscle tone.     Coordination: Coordination normal.     Deep Tendon Reflexes: Reflexes are normal and symmetric. Reflexes normal.  Psychiatric:        Behavior: Behavior normal.        Thought  Content: Thought content  normal.        Judgment: Judgment normal.    X-rays were done of both knees, reported separately.  DJD both knees more medially.       Assessment & Plan:   Encounter Diagnosis  Name Primary?  . Chronic pain of both knees Yes   PROCEDURE NOTE:  The patient requests injections of the right knee , verbal consent was obtained.  The right knee was prepped appropriately after time out was performed.   Sterile technique was observed and injection of 1 cc of Depo-Medrol 40 mg with several cc's of plain xylocaine. Anesthesia was provided by ethyl chloride and a 20-gauge needle was used to inject the knee area. The injection was tolerated well.  A band aid dressing was applied.  The patient was advised to apply ice later today and tomorrow to the injection sight as needed.  PROCEDURE NOTE:  The patient requests injections of the left knee , verbal consent was obtained.  The left knee was prepped appropriately after time out was performed.   Sterile technique was observed and injection of 1 cc of Depo-Medrol 40 mg with several cc's of plain xylocaine. Anesthesia was provided by ethyl chloride and a 20-gauge needle was used to inject the knee area. The injection was tolerated well.  A band aid dressing was applied.  The patient was advised to apply ice later today and tomorrow to the injection sight as needed.  Stop Advil. Begin Aleve one bid pc.  Return in two weeks.  Call if any problem.  Precautions discussed.   Electronically Signed Sanjuana Kava, MD 3/4/20203:05 PM

## 2018-11-03 ENCOUNTER — Ambulatory Visit: Payer: Medicare HMO | Admitting: Orthopaedic Surgery

## 2018-11-03 ENCOUNTER — Encounter: Payer: Self-pay | Admitting: Orthopaedic Surgery

## 2018-11-03 ENCOUNTER — Other Ambulatory Visit: Payer: Self-pay

## 2018-11-03 VITALS — BP 147/74 | HR 59 | Ht 58.5 in | Wt 132.0 lb

## 2018-11-03 DIAGNOSIS — M25561 Pain in right knee: Secondary | ICD-10-CM | POA: Diagnosis not present

## 2018-11-03 DIAGNOSIS — M25562 Pain in left knee: Secondary | ICD-10-CM

## 2018-11-03 DIAGNOSIS — G8929 Other chronic pain: Secondary | ICD-10-CM | POA: Diagnosis not present

## 2018-11-03 NOTE — Progress Notes (Signed)
Patient GY:JEHUD Cindy Hopkins., female DOB:02-Aug-1941, 78 y.o. JSH:[id number]  Chief Complaint  Patient presents with  . Knee Pain    HPI  Cindy Hopkins is a 78 y.o. female who has knee pain, more on the right than the left.  She had injections last time and they helped.  She has switched to Aleve bid and it helps. She has no new trauma. She still has pain and soreness but not as much.  It is more activity and weather related.  She has no redness or numbness.   Body mass index is 27.12 kg/m.  ROS  Review of Systems  Constitutional: Positive for activity change.  Musculoskeletal: Positive for arthralgias, gait problem and joint swelling.  Psychiatric/Behavioral: The patient is nervous/anxious.   All other systems reviewed and are negative.   All other systems reviewed and are negative.  The following is a summary of the past history medically, past history surgically, known current medicines, social history and family history.  This information is gathered electronically by the computer from prior information and documentation.  I review this each visit and have found including this information at this point in the chart is beneficial and informative.    Past Medical History:  Diagnosis Date  . Abnormal Pap smear   . Anxiety   . Cancer ([place])    skin  . Depression   . Hypercholesteremia 11/12/2012  . Hypertension   . Hyperthyroidism 11/12/2012  . Rectocele 11/12/2012  . Thyroid disease   . Urge incontinence 11/12/2012  . Vaginal Pap smear, abnormal     Past Surgical History:  Procedure Laterality Date  . ABDOMINAL HYSTERECTOMY    . bladder and bowel     mesh put in  . BLADDER SURGERY     sling x2  . EYE SURGERY      Family History  Problem Relation Age of Onset  . Hypertension Mother   . Coronary artery disease Mother   . Diabetes Brother   . Cancer Paternal Grandmother        breast  . Diabetes Maternal Grandfather   . COPD Father   . Coronary artery disease  Brother   . Diabetes Maternal Uncle     Social History Social History   Tobacco Use  . Smoking status: Former Smoker    Types: Cigarettes    Last attempt to quit: 08/18/1974    Years since quitting: 44.2  . Smokeless tobacco: Never Used  Substance Use Topics  . Alcohol use: Yes    Comment: wine rarely  . Drug use: No    No Known Allergies  Current Outpatient Medications  Medication Sig Dispense Refill  . aspirin 81 MG tablet Take 81 mg by mouth daily.    Marland Kitchen atenolol (TENORMIN) 50 MG tablet Take 50 mg by mouth 2 (two) times daily.    Marland Kitchen escitalopram (LEXAPRO) 20 MG tablet every morning before breakfast.    . estradiol (ESTRACE) 0.1 MG/GM vaginal cream Use  2x weekly 0.5gms in vagina 42.5 g 1  . fluticasone (FLONASE) 50 MCG/ACT nasal spray SPRAY 2 SPRAYS INTO EACH NOSTRIL TWICE DAILY FOR 7 DAYS; THEN USE ONCE DAILY AS NEEDED. 16 g PRN  . lovastatin (MEVACOR) 10 MG tablet Take 20 mg by mouth at bedtime.     . meloxicam (MOBIC) 15 MG tablet Take 15 mg by mouth daily.     . methimazole (TAPAZOLE) 5 MG tablet Take 5 mg by mouth daily. One daily on Monday Wednesday and  Fridays and two on Saturdays    . pantoprazole (PROTONIX) 40 MG tablet Take 40 mg by mouth daily.     Marland Kitchen zolpidem (AMBIEN) 10 MG tablet Take 10 mg by mouth at bedtime as needed for sleep.     No current facility-administered medications for this visit.      Physical Exam  Blood pressure (!) 147/74, pulse (!) 59, height 4' 10.5" (1.486 m), weight 132 lb (59.9 kg).  Constitutional: overall normal hygiene, normal nutrition, well developed, normal grooming, normal body habitus. Assistive device:none  Musculoskeletal: gait and station Limp right, muscle tone and strength are normal, no tremors or atrophy is present.  .  Neurological: coordination overall normal.  Deep tendon reflex/nerve stretch intact.  Sensation normal.  Cranial nerves II-XII intact.   Skin:   Normal overall no scars, lesions, ulcers or rashes. No  psoriasis.  Psychiatric: Alert and oriented x 3.  Recent memory intact, remote memory unclear.  Normal mood and affect. Well groomed.  Good eye contact.  Cardiovascular: overall no swelling, no varicosities, no edema bilaterally, normal temperatures of the legs and arms, no clubbing, cyanosis and good capillary refill.  Lymphatic: palpation is normal.  Knees with slight effusion, limp right, right knee ROM 0 to 105, left 0 to 110, crepitus bilaterally, knees stable, NV intact.  All other systems reviewed and are negative   The patient has been educated about the nature of the problem(s) and counseled on treatment options.  The patient appeared to understand what I have discussed and is in agreement with it.  Encounter Diagnosis  Name Primary?  . Chronic pain of both knees Yes    PLAN Call if any problems.  Precautions discussed.  Continue current medications.   Return to clinic 3 weeks   Electronically Signed Sanjuana Kava, MD 3/18/20202:53 PM

## 2018-11-23 ENCOUNTER — Encounter: Payer: Self-pay | Admitting: Orthopaedic Surgery

## 2018-11-23 ENCOUNTER — Ambulatory Visit: Payer: Medicare HMO | Admitting: Orthopaedic Surgery

## 2018-11-23 ENCOUNTER — Other Ambulatory Visit: Payer: Self-pay

## 2018-11-23 VITALS — Temp 97.9°F | Ht 58.5 in | Wt 132.0 lb

## 2018-11-23 DIAGNOSIS — G8929 Other chronic pain: Secondary | ICD-10-CM

## 2018-11-23 DIAGNOSIS — M25562 Pain in left knee: Secondary | ICD-10-CM

## 2018-11-23 DIAGNOSIS — M25561 Pain in right knee: Secondary | ICD-10-CM

## 2018-11-23 NOTE — Progress Notes (Signed)
CC: Both of my knees are hurting. I would like an injection in both knees.  The patient has had chronic pain and tenderness of both knees for some time.  Injections help.  There is no locking or giving way of the knee.  There is no new trauma. There is no redness or signs of infections.  The knees have a mild effusion and some crepitus.  There is no redness or signs of recent trauma.  Right knee ROM is 0-110 and left knee ROM is 0-105.  Impression:  Chronic pain of the both knees  Return:  6 weeks  PROCEDURE NOTE:  The patient requests injections of both knees, verbal consent was obtained.  The left and right knee were individually prepped appropriately after time out was performed.   Sterile technique was observed and injection of 1 cc of Depo-Medrol 40 mg with several cc's of plain xylocaine. Anesthesia was provided by ethyl chloride and a 20-gauge needle was used to inject each knee area. The injections were tolerated well.  A band aid dressing was applied.  The patient was advised to apply ice later today and tomorrow to the injection sight as needed.  Electronically Signed Sanjuana Kava, MD 4/7/20209:41 AM

## 2018-11-24 ENCOUNTER — Ambulatory Visit: Payer: Medicare HMO | Admitting: Orthopaedic Surgery

## 2018-11-25 ENCOUNTER — Telehealth: Payer: Self-pay | Admitting: Radiology

## 2018-11-25 NOTE — Telephone Encounter (Signed)
FYI patient called, and said that the injections given on Tuesday have really increased her knee pain this time.  No redness/heat.  Minimal swelling.  I advised ice/elevate and give it two weeks to let medication work, and to call us if feeling worse/no better in that time.

## 2018-11-29 NOTE — Telephone Encounter (Signed)
Agreed.  Give it some time

## 2018-12-10 DIAGNOSIS — Z0000 Encounter for general adult medical examination without abnormal findings: Secondary | ICD-10-CM | POA: Diagnosis not present

## 2019-01-04 ENCOUNTER — Ambulatory Visit: Payer: Medicare HMO | Admitting: Orthopaedic Surgery

## 2019-01-04 ENCOUNTER — Encounter: Payer: Self-pay | Admitting: Orthopaedic Surgery

## 2019-01-04 ENCOUNTER — Other Ambulatory Visit: Payer: Self-pay

## 2019-01-04 VITALS — BP 149/83 | HR 61 | Temp 97.2°F | Ht 58.5 in | Wt 132.0 lb

## 2019-01-04 DIAGNOSIS — M25562 Pain in left knee: Secondary | ICD-10-CM

## 2019-01-04 DIAGNOSIS — M25561 Pain in right knee: Secondary | ICD-10-CM

## 2019-01-04 DIAGNOSIS — G8929 Other chronic pain: Secondary | ICD-10-CM

## 2019-01-04 NOTE — Progress Notes (Signed)
CC: Both of my knees are hurting. I would like an injection in both knees.  The patient has had chronic pain and tenderness of both knees for some time.  Injections help.  There is no locking or giving way of the knee.  There is no new trauma. There is no redness or signs of infections.  The knees have a mild effusion and some crepitus.  There is no redness or signs of recent trauma.  Right knee ROM is 0-110 and left knee ROM is 0-105.  Impression:  Chronic pain of the both knees  Return:  6 weeks  PROCEDURE NOTE:  The patient requests injections of both knees, verbal consent was obtained.  The left and right knee were individually prepped appropriately after time out was performed.   Sterile technique was observed and injection of 1 cc of Depo-Medrol 40 mg with several cc's of plain xylocaine. Anesthesia was provided by ethyl chloride and a 20-gauge needle was used to inject each knee area. The injections were tolerated well.  A band aid dressing was applied.  The patient was advised to apply ice later today and tomorrow to the injection sight as needed.   Electronically Signed Sanjuana Kava, MD 5/19/20209:47 AM

## 2019-01-25 DIAGNOSIS — X32XXXA Exposure to sunlight, initial encounter: Secondary | ICD-10-CM | POA: Diagnosis not present

## 2019-01-25 DIAGNOSIS — L570 Actinic keratosis: Secondary | ICD-10-CM | POA: Diagnosis not present

## 2019-01-25 DIAGNOSIS — D225 Melanocytic nevi of trunk: Secondary | ICD-10-CM | POA: Diagnosis not present

## 2019-02-15 ENCOUNTER — Other Ambulatory Visit: Payer: Self-pay

## 2019-02-15 ENCOUNTER — Encounter: Payer: Self-pay | Admitting: Orthopaedic Surgery

## 2019-02-15 ENCOUNTER — Ambulatory Visit (INDEPENDENT_AMBULATORY_CARE_PROVIDER_SITE_OTHER): Payer: Medicare HMO | Admitting: Orthopaedic Surgery

## 2019-02-15 VITALS — Temp 97.1°F | Ht 58.5 in | Wt 132.0 lb

## 2019-02-15 DIAGNOSIS — G8929 Other chronic pain: Secondary | ICD-10-CM

## 2019-02-15 DIAGNOSIS — M25562 Pain in left knee: Secondary | ICD-10-CM

## 2019-02-15 DIAGNOSIS — M25561 Pain in right knee: Secondary | ICD-10-CM | POA: Diagnosis not present

## 2019-02-15 NOTE — Progress Notes (Signed)
CC: Both of my knees are hurting. I would like an injection in both knees.  The patient has had chronic pain and tenderness of both knees for some time.  Injections help.  There is no locking or giving way of the knee.  There is no new trauma. There is no redness or signs of infections.  The knees have a mild effusion and some crepitus.  There is no redness or signs of recent trauma.  Right knee ROM is 0-105 and left knee ROM is 0-110.  Impression:  Chronic pain of the both knees  Return:  6 weeks  PROCEDURE NOTE:  The patient requests injections of both knees, verbal consent was obtained.  The left and right knee were individually prepped appropriately after time out was performed.   Sterile technique was observed and injection of 1 cc of Depo-Medrol 40 mg with several cc's of plain xylocaine. Anesthesia was provided by ethyl chloride and a 20-gauge needle was used to inject each knee area. The injections were tolerated well.  A band aid dressing was applied.  The patient was advised to apply ice later today and tomorrow to the injection sight as needed.  Electronically Signed Sanjuana Kava, MD 6/30/20209:41 AM

## 2019-03-15 DIAGNOSIS — M1990 Unspecified osteoarthritis, unspecified site: Secondary | ICD-10-CM | POA: Diagnosis not present

## 2019-03-29 ENCOUNTER — Encounter: Payer: Self-pay | Admitting: Orthopaedic Surgery

## 2019-03-29 ENCOUNTER — Other Ambulatory Visit: Payer: Self-pay

## 2019-03-29 ENCOUNTER — Ambulatory Visit (INDEPENDENT_AMBULATORY_CARE_PROVIDER_SITE_OTHER): Payer: Medicare HMO | Admitting: Orthopaedic Surgery

## 2019-03-29 VITALS — Temp 98.8°F

## 2019-03-29 DIAGNOSIS — G8929 Other chronic pain: Secondary | ICD-10-CM | POA: Diagnosis not present

## 2019-03-29 DIAGNOSIS — M25561 Pain in right knee: Secondary | ICD-10-CM

## 2019-03-29 DIAGNOSIS — M25562 Pain in left knee: Secondary | ICD-10-CM | POA: Diagnosis not present

## 2019-03-29 NOTE — Progress Notes (Signed)
CC: Both of my knees are hurting. I would like an injection in both knees.  The patient has had chronic pain and tenderness of both knees for some time.  Injections help.  There is no locking or giving way of the knee.  There is no new trauma. There is no redness or signs of infections.  The knees have a mild effusion and some crepitus.  There is no redness or signs of recent trauma.  Right knee ROM is 0-105 and left knee ROM is 0-110.  Impression:  Chronic pain of the both knees  Return:  6 weeks  PROCEDURE NOTE:  The patient requests injections of both knees, verbal consent was obtained.  The left and right knee were individually prepped appropriately after time out was performed.   Sterile technique was observed and injection of 1 cc of Depo-Medrol 40 mg with several cc's of plain xylocaine. Anesthesia was provided by ethyl chloride and a 20-gauge needle was used to inject each knee area. The injections were tolerated well.  A band aid dressing was applied.  The patient was advised to apply ice later today and tomorrow to the injection sight as needed.  Electronically Signed Sanjuana Kava, MD 8/11/202010:14 AM

## 2019-04-06 DIAGNOSIS — E782 Mixed hyperlipidemia: Secondary | ICD-10-CM | POA: Diagnosis not present

## 2019-04-06 DIAGNOSIS — E785 Hyperlipidemia, unspecified: Secondary | ICD-10-CM | POA: Diagnosis not present

## 2019-04-06 DIAGNOSIS — E0590 Thyrotoxicosis, unspecified without thyrotoxic crisis or storm: Secondary | ICD-10-CM | POA: Diagnosis not present

## 2019-04-06 DIAGNOSIS — I10 Essential (primary) hypertension: Secondary | ICD-10-CM | POA: Diagnosis not present

## 2019-04-06 DIAGNOSIS — E039 Hypothyroidism, unspecified: Secondary | ICD-10-CM | POA: Diagnosis not present

## 2019-04-11 DIAGNOSIS — E782 Mixed hyperlipidemia: Secondary | ICD-10-CM | POA: Diagnosis not present

## 2019-04-11 DIAGNOSIS — M25569 Pain in unspecified knee: Secondary | ICD-10-CM | POA: Diagnosis not present

## 2019-04-11 DIAGNOSIS — I6529 Occlusion and stenosis of unspecified carotid artery: Secondary | ICD-10-CM | POA: Diagnosis not present

## 2019-04-11 DIAGNOSIS — M79606 Pain in leg, unspecified: Secondary | ICD-10-CM | POA: Diagnosis not present

## 2019-04-11 DIAGNOSIS — F5101 Primary insomnia: Secondary | ICD-10-CM | POA: Diagnosis not present

## 2019-04-11 DIAGNOSIS — E039 Hypothyroidism, unspecified: Secondary | ICD-10-CM | POA: Diagnosis not present

## 2019-04-11 DIAGNOSIS — F39 Unspecified mood [affective] disorder: Secondary | ICD-10-CM | POA: Diagnosis not present

## 2019-04-11 DIAGNOSIS — K219 Gastro-esophageal reflux disease without esophagitis: Secondary | ICD-10-CM | POA: Diagnosis not present

## 2019-04-11 DIAGNOSIS — I10 Essential (primary) hypertension: Secondary | ICD-10-CM | POA: Diagnosis not present

## 2019-04-21 DIAGNOSIS — E785 Hyperlipidemia, unspecified: Secondary | ICD-10-CM | POA: Diagnosis not present

## 2019-04-21 DIAGNOSIS — Z9181 History of falling: Secondary | ICD-10-CM | POA: Diagnosis not present

## 2019-04-21 DIAGNOSIS — K219 Gastro-esophageal reflux disease without esophagitis: Secondary | ICD-10-CM | POA: Diagnosis not present

## 2019-04-21 DIAGNOSIS — F5101 Primary insomnia: Secondary | ICD-10-CM | POA: Diagnosis not present

## 2019-04-21 DIAGNOSIS — H02403 Unspecified ptosis of bilateral eyelids: Secondary | ICD-10-CM | POA: Diagnosis not present

## 2019-04-21 DIAGNOSIS — N952 Postmenopausal atrophic vaginitis: Secondary | ICD-10-CM | POA: Diagnosis not present

## 2019-04-21 DIAGNOSIS — K210 Gastro-esophageal reflux disease with esophagitis: Secondary | ICD-10-CM | POA: Diagnosis not present

## 2019-04-21 DIAGNOSIS — Z0000 Encounter for general adult medical examination without abnormal findings: Secondary | ICD-10-CM | POA: Diagnosis not present

## 2019-04-21 DIAGNOSIS — I519 Heart disease, unspecified: Secondary | ICD-10-CM | POA: Diagnosis not present

## 2019-05-09 DIAGNOSIS — F5101 Primary insomnia: Secondary | ICD-10-CM | POA: Diagnosis not present

## 2019-05-09 DIAGNOSIS — K219 Gastro-esophageal reflux disease without esophagitis: Secondary | ICD-10-CM | POA: Diagnosis not present

## 2019-05-09 DIAGNOSIS — D72828 Other elevated white blood cell count: Secondary | ICD-10-CM | POA: Diagnosis not present

## 2019-05-09 DIAGNOSIS — F39 Unspecified mood [affective] disorder: Secondary | ICD-10-CM | POA: Diagnosis not present

## 2019-05-09 DIAGNOSIS — I6529 Occlusion and stenosis of unspecified carotid artery: Secondary | ICD-10-CM | POA: Diagnosis not present

## 2019-05-09 DIAGNOSIS — E039 Hypothyroidism, unspecified: Secondary | ICD-10-CM | POA: Diagnosis not present

## 2019-05-09 DIAGNOSIS — I10 Essential (primary) hypertension: Secondary | ICD-10-CM | POA: Diagnosis not present

## 2019-05-09 DIAGNOSIS — E782 Mixed hyperlipidemia: Secondary | ICD-10-CM | POA: Diagnosis not present

## 2019-05-10 ENCOUNTER — Ambulatory Visit (INDEPENDENT_AMBULATORY_CARE_PROVIDER_SITE_OTHER): Payer: Medicare HMO | Admitting: Orthopaedic Surgery

## 2019-05-10 ENCOUNTER — Other Ambulatory Visit: Payer: Self-pay

## 2019-05-10 ENCOUNTER — Encounter: Payer: Self-pay | Admitting: Orthopaedic Surgery

## 2019-05-10 VITALS — BP 145/69 | HR 68 | Temp 96.6°F | Ht 58.5 in | Wt 133.4 lb

## 2019-05-10 DIAGNOSIS — G8929 Other chronic pain: Secondary | ICD-10-CM

## 2019-05-10 DIAGNOSIS — M25561 Pain in right knee: Secondary | ICD-10-CM | POA: Diagnosis not present

## 2019-05-10 DIAGNOSIS — M25562 Pain in left knee: Secondary | ICD-10-CM | POA: Diagnosis not present

## 2019-05-10 NOTE — Progress Notes (Signed)
CC: Both of my knees are hurting. I would like an injection in both knees.  The patient has had chronic pain and tenderness of both knees for some time.  Injections help.  There is no locking or giving way of the knee.  There is no new trauma. There is no redness or signs of infections.  The knees have a mild effusion and some crepitus.  There is no redness or signs of recent trauma.  Right knee ROM is 0-110 and left knee ROM is 0-105.  Impression:  Chronic pain of the both knees  Return:  6 weeks  PROCEDURE NOTE:  The patient requests injections of both knees, verbal consent was obtained.  The left and right knee were individually prepped appropriately after time out was performed.   Sterile technique was observed and injection of 1 cc of Depo-Medrol 40 mg with several cc's of plain xylocaine. Anesthesia was provided by ethyl chloride and a 20-gauge needle was used to inject each knee area. The injections were tolerated well.  A band aid dressing was applied.  The patient was advised to apply ice later today and tomorrow to the injection sight as needed.  Electronically Signed Sanjuana Kava, MD 9/22/202010:34 AM

## 2019-05-26 ENCOUNTER — Other Ambulatory Visit: Payer: Self-pay

## 2019-05-26 ENCOUNTER — Ambulatory Visit (INDEPENDENT_AMBULATORY_CARE_PROVIDER_SITE_OTHER): Payer: Medicare HMO | Admitting: Otolaryngology

## 2019-05-26 DIAGNOSIS — H903 Sensorineural hearing loss, bilateral: Secondary | ICD-10-CM | POA: Diagnosis not present

## 2019-05-30 DIAGNOSIS — Z20828 Contact with and (suspected) exposure to other viral communicable diseases: Secondary | ICD-10-CM | POA: Diagnosis not present

## 2019-06-21 ENCOUNTER — Encounter: Payer: Self-pay | Admitting: Orthopaedic Surgery

## 2019-06-21 ENCOUNTER — Ambulatory Visit (INDEPENDENT_AMBULATORY_CARE_PROVIDER_SITE_OTHER): Payer: Medicare HMO | Admitting: Orthopaedic Surgery

## 2019-06-21 DIAGNOSIS — M25562 Pain in left knee: Secondary | ICD-10-CM

## 2019-06-21 DIAGNOSIS — M25561 Pain in right knee: Secondary | ICD-10-CM | POA: Diagnosis not present

## 2019-06-21 DIAGNOSIS — G8929 Other chronic pain: Secondary | ICD-10-CM | POA: Diagnosis not present

## 2019-06-21 NOTE — Progress Notes (Signed)
CC: Both of my knees are hurting. I would like an injection in both knees.  The patient has had chronic pain and tenderness of both knees for some time.  Injections help.  There is no locking or giving way of the knee.  There is no new trauma. There is no redness or signs of infections.  The knees have a mild effusion and some crepitus.  There is no redness or signs of recent trauma.  Right knee ROM is 0-105 and left knee ROM is 0-110.  Impression:  Chronic pain of the both knees  Return:  6 weeks  PROCEDURE NOTE:  The patient requests injections of both knees, verbal consent was obtained.  The left and right knee were individually prepped appropriately after time out was performed.   Sterile technique was observed and injection of 1 cc of Depo-Medrol 40 mg with several cc's of plain xylocaine. Anesthesia was provided by ethyl chloride and a 20-gauge needle was used to inject each knee area. The injections were tolerated well.  A band aid dressing was applied.  The patient was advised to apply ice later today and tomorrow to the injection sight as needed.   Electronically Signed Sanjuana Kava, MD 11/3/202010:40 AM

## 2019-06-24 DIAGNOSIS — F5101 Primary insomnia: Secondary | ICD-10-CM | POA: Diagnosis not present

## 2019-06-24 DIAGNOSIS — D72828 Other elevated white blood cell count: Secondary | ICD-10-CM | POA: Diagnosis not present

## 2019-06-24 DIAGNOSIS — E039 Hypothyroidism, unspecified: Secondary | ICD-10-CM | POA: Diagnosis not present

## 2019-06-24 DIAGNOSIS — I10 Essential (primary) hypertension: Secondary | ICD-10-CM | POA: Diagnosis not present

## 2019-06-24 DIAGNOSIS — K219 Gastro-esophageal reflux disease without esophagitis: Secondary | ICD-10-CM | POA: Diagnosis not present

## 2019-06-24 DIAGNOSIS — E782 Mixed hyperlipidemia: Secondary | ICD-10-CM | POA: Diagnosis not present

## 2019-06-24 DIAGNOSIS — I6529 Occlusion and stenosis of unspecified carotid artery: Secondary | ICD-10-CM | POA: Diagnosis not present

## 2019-06-24 DIAGNOSIS — F39 Unspecified mood [affective] disorder: Secondary | ICD-10-CM | POA: Diagnosis not present

## 2019-06-27 DIAGNOSIS — H9201 Otalgia, right ear: Secondary | ICD-10-CM | POA: Diagnosis not present

## 2019-08-02 ENCOUNTER — Ambulatory Visit: Payer: Medicare HMO | Admitting: Orthopaedic Surgery

## 2019-08-09 ENCOUNTER — Encounter: Payer: Self-pay | Admitting: Orthopaedic Surgery

## 2019-08-09 ENCOUNTER — Other Ambulatory Visit: Payer: Self-pay

## 2019-08-09 ENCOUNTER — Ambulatory Visit (INDEPENDENT_AMBULATORY_CARE_PROVIDER_SITE_OTHER): Payer: Medicare HMO | Admitting: Orthopaedic Surgery

## 2019-08-09 VITALS — Temp 96.5°F | Ht 58.5 in | Wt 133.0 lb

## 2019-08-09 DIAGNOSIS — M25561 Pain in right knee: Secondary | ICD-10-CM

## 2019-08-09 DIAGNOSIS — M25562 Pain in left knee: Secondary | ICD-10-CM | POA: Diagnosis not present

## 2019-08-09 DIAGNOSIS — G8929 Other chronic pain: Secondary | ICD-10-CM | POA: Diagnosis not present

## 2019-08-09 NOTE — Progress Notes (Signed)
CC: Both of my knees are hurting. I would like an injection in both knees.  The patient has had chronic pain and tenderness of both knees for some time.  Injections help.  There is no locking or giving way of the knee.  There is no new trauma. There is no redness or signs of infections.  The knees have a mild effusion and some crepitus.  There is no redness or signs of recent trauma.  Right knee ROM is 0-110 and left knee ROM is 0-105.  Impression:  Chronic pain of the both knees  Return:  6 weeks  PROCEDURE NOTE:  The patient requests injections of both knees, verbal consent was obtained.  The left and right knee were individually prepped appropriately after time out was performed.   Sterile technique was observed and injection of 1 cc of Depo-Medrol 40 mg with several cc's of plain xylocaine. Anesthesia was provided by ethyl chloride and a 20-gauge needle was used to inject each knee area. The injections were tolerated well.  A band aid dressing was applied.  The patient was advised to apply ice later today and tomorrow to the injection sight as needed.   Electronically Signed Sanjuana Kava, MD 12/22/202010:32 AM

## 2019-08-30 DIAGNOSIS — H52209 Unspecified astigmatism, unspecified eye: Secondary | ICD-10-CM | POA: Diagnosis not present

## 2019-09-12 DIAGNOSIS — I10 Essential (primary) hypertension: Secondary | ICD-10-CM | POA: Diagnosis not present

## 2019-09-12 DIAGNOSIS — M791 Myalgia, unspecified site: Secondary | ICD-10-CM | POA: Diagnosis not present

## 2019-09-12 DIAGNOSIS — I519 Heart disease, unspecified: Secondary | ICD-10-CM | POA: Diagnosis not present

## 2019-09-12 DIAGNOSIS — E785 Hyperlipidemia, unspecified: Secondary | ICD-10-CM | POA: Diagnosis not present

## 2019-09-12 DIAGNOSIS — K219 Gastro-esophageal reflux disease without esophagitis: Secondary | ICD-10-CM | POA: Diagnosis not present

## 2019-09-12 DIAGNOSIS — I2510 Atherosclerotic heart disease of native coronary artery without angina pectoris: Secondary | ICD-10-CM | POA: Diagnosis not present

## 2019-09-12 DIAGNOSIS — E039 Hypothyroidism, unspecified: Secondary | ICD-10-CM | POA: Diagnosis not present

## 2019-09-12 DIAGNOSIS — F5101 Primary insomnia: Secondary | ICD-10-CM | POA: Diagnosis not present

## 2019-09-12 DIAGNOSIS — F339 Major depressive disorder, recurrent, unspecified: Secondary | ICD-10-CM | POA: Diagnosis not present

## 2019-09-20 ENCOUNTER — Other Ambulatory Visit: Payer: Self-pay

## 2019-09-20 ENCOUNTER — Encounter: Payer: Self-pay | Admitting: Orthopaedic Surgery

## 2019-09-20 ENCOUNTER — Ambulatory Visit: Payer: Medicare HMO | Admitting: Orthopaedic Surgery

## 2019-09-20 VITALS — Temp 97.1°F

## 2019-09-20 DIAGNOSIS — M25561 Pain in right knee: Secondary | ICD-10-CM | POA: Diagnosis not present

## 2019-09-20 DIAGNOSIS — G8929 Other chronic pain: Secondary | ICD-10-CM

## 2019-09-20 DIAGNOSIS — M25562 Pain in left knee: Secondary | ICD-10-CM | POA: Diagnosis not present

## 2019-09-20 DIAGNOSIS — Z0100 Encounter for examination of eyes and vision without abnormal findings: Secondary | ICD-10-CM | POA: Diagnosis not present

## 2019-09-20 NOTE — Progress Notes (Signed)
CC: Both of my knees are hurting. I would like an injection in both knees.  The patient has had chronic pain and tenderness of both knees for some time.  Injections help.  There is no locking or giving way of the knee.  There is no new trauma. There is no redness or signs of infections.  The knees have a mild effusion and some crepitus.  There is no redness or signs of recent trauma.  Right knee ROM is 0-105 and left knee ROM is 0-110.  Impression:  Chronic pain of the both knees  Return:  as needed.  PROCEDURE NOTE:  The patient requests injections of both knees, verbal consent was obtained.  The left and right knee were individually prepped appropriately after time out was performed.   Sterile technique was observed and injection of 1 cc of Depo-Medrol 40 mg with several cc's of plain xylocaine. Anesthesia was provided by ethyl chloride and a 20-gauge needle was used to inject each knee area. The injections were tolerated well.  A band aid dressing was applied.  The patient was advised to apply ice later today and tomorrow to the injection sight as needed.    Electronically Signed Sanjuana Kava, MD 2/2/202110:36 AM

## 2019-10-05 DIAGNOSIS — H02403 Unspecified ptosis of bilateral eyelids: Secondary | ICD-10-CM | POA: Diagnosis not present

## 2019-10-05 DIAGNOSIS — I519 Heart disease, unspecified: Secondary | ICD-10-CM | POA: Diagnosis not present

## 2019-10-05 DIAGNOSIS — E785 Hyperlipidemia, unspecified: Secondary | ICD-10-CM | POA: Diagnosis not present

## 2019-10-05 DIAGNOSIS — Z9181 History of falling: Secondary | ICD-10-CM | POA: Diagnosis not present

## 2019-10-05 DIAGNOSIS — Z0000 Encounter for general adult medical examination without abnormal findings: Secondary | ICD-10-CM | POA: Diagnosis not present

## 2019-10-05 DIAGNOSIS — K2100 Gastro-esophageal reflux disease with esophagitis, without bleeding: Secondary | ICD-10-CM | POA: Diagnosis not present

## 2019-10-05 DIAGNOSIS — K219 Gastro-esophageal reflux disease without esophagitis: Secondary | ICD-10-CM | POA: Diagnosis not present

## 2019-10-05 DIAGNOSIS — N952 Postmenopausal atrophic vaginitis: Secondary | ICD-10-CM | POA: Diagnosis not present

## 2019-10-05 DIAGNOSIS — F5101 Primary insomnia: Secondary | ICD-10-CM | POA: Diagnosis not present

## 2019-10-10 DIAGNOSIS — E782 Mixed hyperlipidemia: Secondary | ICD-10-CM | POA: Diagnosis not present

## 2019-10-10 DIAGNOSIS — K219 Gastro-esophageal reflux disease without esophagitis: Secondary | ICD-10-CM | POA: Diagnosis not present

## 2019-10-10 DIAGNOSIS — F5101 Primary insomnia: Secondary | ICD-10-CM | POA: Diagnosis not present

## 2019-10-10 DIAGNOSIS — I10 Essential (primary) hypertension: Secondary | ICD-10-CM | POA: Diagnosis not present

## 2019-10-10 DIAGNOSIS — E039 Hypothyroidism, unspecified: Secondary | ICD-10-CM | POA: Diagnosis not present

## 2019-10-10 DIAGNOSIS — M79606 Pain in leg, unspecified: Secondary | ICD-10-CM | POA: Diagnosis not present

## 2019-10-10 DIAGNOSIS — I6529 Occlusion and stenosis of unspecified carotid artery: Secondary | ICD-10-CM | POA: Diagnosis not present

## 2019-10-10 DIAGNOSIS — M25569 Pain in unspecified knee: Secondary | ICD-10-CM | POA: Diagnosis not present

## 2019-10-10 DIAGNOSIS — F39 Unspecified mood [affective] disorder: Secondary | ICD-10-CM | POA: Diagnosis not present

## 2019-10-18 ENCOUNTER — Other Ambulatory Visit (HOSPITAL_COMMUNITY): Payer: Self-pay | Admitting: Internal Medicine

## 2019-10-18 DIAGNOSIS — M542 Cervicalgia: Secondary | ICD-10-CM

## 2019-10-18 DIAGNOSIS — M546 Pain in thoracic spine: Secondary | ICD-10-CM

## 2019-10-19 ENCOUNTER — Other Ambulatory Visit: Payer: Self-pay | Admitting: Internal Medicine

## 2019-10-19 ENCOUNTER — Other Ambulatory Visit (HOSPITAL_COMMUNITY): Payer: Self-pay | Admitting: Internal Medicine

## 2019-10-19 ENCOUNTER — Other Ambulatory Visit: Payer: Self-pay | Admitting: *Deleted

## 2019-10-19 DIAGNOSIS — I779 Disorder of arteries and arterioles, unspecified: Secondary | ICD-10-CM

## 2019-10-19 DIAGNOSIS — Z1231 Encounter for screening mammogram for malignant neoplasm of breast: Secondary | ICD-10-CM

## 2019-10-25 ENCOUNTER — Other Ambulatory Visit: Payer: Self-pay

## 2019-10-25 ENCOUNTER — Ambulatory Visit (HOSPITAL_COMMUNITY)
Admission: RE | Admit: 2019-10-25 | Discharge: 2019-10-25 | Disposition: A | Discharge status: Home / Self Care | Payer: Medicare HMO | Source: Ambulatory Visit | Attending: Internal Medicine | Admitting: Internal Medicine

## 2019-10-25 DIAGNOSIS — I779 Disorder of arteries and arterioles, unspecified: Secondary | ICD-10-CM | POA: Diagnosis not present

## 2019-10-25 DIAGNOSIS — I6523 Occlusion and stenosis of bilateral carotid arteries: Secondary | ICD-10-CM | POA: Diagnosis not present

## 2019-10-27 ENCOUNTER — Encounter: Payer: Self-pay | Admitting: Orthopaedic Surgery

## 2019-10-27 ENCOUNTER — Other Ambulatory Visit: Payer: Self-pay

## 2019-10-27 ENCOUNTER — Ambulatory Visit: Payer: Medicare HMO | Admitting: Orthopaedic Surgery

## 2019-10-27 DIAGNOSIS — G8929 Other chronic pain: Secondary | ICD-10-CM

## 2019-10-27 DIAGNOSIS — M25562 Pain in left knee: Secondary | ICD-10-CM | POA: Diagnosis not present

## 2019-10-27 DIAGNOSIS — M25561 Pain in right knee: Secondary | ICD-10-CM

## 2019-10-27 NOTE — Progress Notes (Signed)
CC: Both of my knees are hurting. I would like an injection in both knees.  The patient has had chronic pain and tenderness of both knees for some time.  Injections help.  There is no locking or giving way of the knee.  There is no new trauma. There is no redness or signs of infections.  The knees have a mild effusion and some crepitus.  There is no redness or signs of recent trauma.  Right knee ROM is 0-105 and left knee ROM is 0-110.  Impression:  Chronic pain of the both knees  Return:  as needed  PROCEDURE NOTE:  The patient requests injections of both knees, verbal consent was obtained.  The left and right knee were individually prepped appropriately after time out was performed.   Sterile technique was observed and injection of 1 cc of Depo-Medrol 40 mg with several cc's of plain xylocaine. Anesthesia was provided by ethyl chloride and a 20-gauge needle was used to inject each knee area. The injections were tolerated well.  A band aid dressing was applied.  The patient was advised to apply ice later today and tomorrow to the injection sight as needed.   Electronically Signed Sanjuana Kava, MD 3/11/20212:06 PM

## 2019-10-31 ENCOUNTER — Other Ambulatory Visit: Payer: Self-pay

## 2019-10-31 ENCOUNTER — Ambulatory Visit (HOSPITAL_COMMUNITY)
Admission: RE | Admit: 2019-10-31 | Discharge: 2019-10-31 | Disposition: A | Discharge status: Home / Self Care | Payer: Medicare HMO | Source: Ambulatory Visit | Attending: Internal Medicine | Admitting: Internal Medicine

## 2019-10-31 DIAGNOSIS — Z1231 Encounter for screening mammogram for malignant neoplasm of breast: Secondary | ICD-10-CM | POA: Insufficient documentation

## 2019-10-31 DIAGNOSIS — M546 Pain in thoracic spine: Secondary | ICD-10-CM | POA: Diagnosis not present

## 2019-10-31 DIAGNOSIS — M542 Cervicalgia: Secondary | ICD-10-CM

## 2019-10-31 DIAGNOSIS — M47812 Spondylosis without myelopathy or radiculopathy, cervical region: Secondary | ICD-10-CM | POA: Diagnosis not present

## 2019-10-31 DIAGNOSIS — M47814 Spondylosis without myelopathy or radiculopathy, thoracic region: Secondary | ICD-10-CM | POA: Diagnosis not present

## 2019-11-03 ENCOUNTER — Telehealth: Payer: Self-pay | Admitting: Obstetrics & Gynecology

## 2019-11-03 NOTE — Telephone Encounter (Signed)

## 2019-11-07 ENCOUNTER — Ambulatory Visit (INDEPENDENT_AMBULATORY_CARE_PROVIDER_SITE_OTHER): Payer: Medicare HMO | Admitting: Adult Health

## 2019-11-07 ENCOUNTER — Encounter: Payer: Self-pay | Admitting: Adult Health

## 2019-11-07 ENCOUNTER — Other Ambulatory Visit: Payer: Self-pay

## 2019-11-07 VITALS — BP 154/77 | HR 64 | Ht <= 58 in | Wt 133.0 lb

## 2019-11-07 DIAGNOSIS — Z01419 Encounter for gynecological examination (general) (routine) without abnormal findings: Secondary | ICD-10-CM

## 2019-11-07 DIAGNOSIS — Z1212 Encounter for screening for malignant neoplasm of rectum: Secondary | ICD-10-CM

## 2019-11-07 DIAGNOSIS — Z1211 Encounter for screening for malignant neoplasm of colon: Secondary | ICD-10-CM | POA: Diagnosis not present

## 2019-11-07 DIAGNOSIS — N952 Postmenopausal atrophic vaginitis: Secondary | ICD-10-CM

## 2019-11-07 DIAGNOSIS — N816 Rectocele: Secondary | ICD-10-CM | POA: Diagnosis not present

## 2019-11-07 LAB — HEMOCCULT GUIAC POC 1CARD (OFFICE): Fecal Occult Blood, POC: NEGATIVE

## 2019-11-07 MED ORDER — ESTRADIOL 0.1 MG/GM VA CREA: TOPICAL_CREAM | VAGINAL | 1 refills | Status: DC

## 2019-11-07 NOTE — Progress Notes (Signed)
Patient ID: Cindy Life., female   DOB: [birth year], 79 y.o.   MRN: [id number] History of Present Illness: Cindy Hopkins is a 79 year old white female, widowed, sp hysterectomy in for a well woman gyn exam.She is active and lives alone and has 2 dogs.  PCP is Dr Cindy Hopkins.    Current Medications, Allergies, Past Medical History, Past Surgical History, Family History and Social History were reviewed in [employer] record.     Review of Systems: Patient denies any headaches, hearing loss, fatigue, blurred vision, shortness of breath, chest pain, abdominal pain, problems with bowel movements(takes stool softener, can try Senna S), urination, or intercourse(not active). No joint pain or mood swings.    Physical Exam:BP (!) 154/77 (BP Location: Left Arm, Patient Position: Sitting, Cuff Size: Normal)   Pulse 64   Ht 4' 9.25" (1.454 m)   Wt 133 lb (60.3 kg)   BMI 28.53 kg/m  General:  Well developed, well nourished, no acute distress Skin:  Warm and dry Neck:  Midline trachea, normal thyroid, good ROM, no lymphadenopathy,no carotid bruits heard Lungs; Clear to auscultation bilaterally Breast:  No dominant palpable mass, retraction, or nipple discharge Cardiovascular: Regular rate and rhythm Abdomen:  Soft, non tender, no hepatosplenomegaly Pelvic:  External genitalia is normal in appearance, no lesions.  The vagina is pale with loss of moisture and rugae. Urethra has no lesions or masses. The cervix and uterus are absent.  No adnexal masses or tenderness noted.Bladder is non tender, no masses felt. Rectal: Good sphincter tone, no polyps, or hemorrhoids felt.  Hemoccult negative.+rectocele Extremities/musculoskeletal:  No swelling or varicosities noted, no clubbing or cyanosis Psych:  No mood changes, alert and cooperative,seems happy Fall risk is low PHQ 2 score is 0. Examination chaperoned by Cindy Hopkins young LPN   Impression and plan: 1. Encounter for well woman exam with routine  gynecological exam Physical in 2 years  Mammogram yearly Labs with PCP   2. Rectocele Try senna S to easy with BMs  3. Vaginal atrophy Continue estrace cream Meds ordered this encounter  Medications  . estradiol (ESTRACE) 0.1 MG/GM vaginal cream    Sig: Use  2x weekly 0.5gms in vagina    Dispense:  42.5 g    Refill:  1    Order Specific Question:   Supervising Provider    Answer:   EURE, LUTHER H [[id number]]    4. Screening for colorectal cancer

## 2019-11-08 ENCOUNTER — Other Ambulatory Visit (HOSPITAL_COMMUNITY): Payer: Self-pay | Admitting: Internal Medicine

## 2019-11-08 ENCOUNTER — Other Ambulatory Visit: Payer: Self-pay | Admitting: Internal Medicine

## 2019-11-08 DIAGNOSIS — M542 Cervicalgia: Secondary | ICD-10-CM

## 2019-12-01 DIAGNOSIS — F5101 Primary insomnia: Secondary | ICD-10-CM | POA: Diagnosis not present

## 2019-12-01 DIAGNOSIS — K219 Gastro-esophageal reflux disease without esophagitis: Secondary | ICD-10-CM | POA: Diagnosis not present

## 2019-12-01 DIAGNOSIS — I2510 Atherosclerotic heart disease of native coronary artery without angina pectoris: Secondary | ICD-10-CM | POA: Diagnosis not present

## 2019-12-01 DIAGNOSIS — E785 Hyperlipidemia, unspecified: Secondary | ICD-10-CM | POA: Diagnosis not present

## 2019-12-01 DIAGNOSIS — E039 Hypothyroidism, unspecified: Secondary | ICD-10-CM | POA: Diagnosis not present

## 2019-12-01 DIAGNOSIS — I519 Heart disease, unspecified: Secondary | ICD-10-CM | POA: Diagnosis not present

## 2019-12-01 DIAGNOSIS — I10 Essential (primary) hypertension: Secondary | ICD-10-CM | POA: Diagnosis not present

## 2019-12-01 DIAGNOSIS — M791 Myalgia, unspecified site: Secondary | ICD-10-CM | POA: Diagnosis not present

## 2019-12-01 DIAGNOSIS — F339 Major depressive disorder, recurrent, unspecified: Secondary | ICD-10-CM | POA: Diagnosis not present

## 2019-12-12 ENCOUNTER — Ambulatory Visit (HOSPITAL_COMMUNITY): Payer: Medicare HMO

## 2019-12-12 ENCOUNTER — Encounter (HOSPITAL_COMMUNITY): Payer: Self-pay

## 2019-12-13 ENCOUNTER — Other Ambulatory Visit: Payer: Self-pay | Admitting: Adult Health

## 2019-12-13 ENCOUNTER — Telehealth: Payer: Self-pay | Admitting: Orthopaedic Surgery

## 2019-12-13 MED ORDER — ESTRADIOL 0.1 MG/GM VA CREA: TOPICAL_CREAM | VAGINAL | 1 refills | Status: DC

## 2019-12-13 NOTE — Telephone Encounter (Signed)
Call received from patient per voice message - patient mentioned injection; call returned - reached voice mail; left message.

## 2019-12-13 NOTE — Progress Notes (Signed)
Refill estrace

## 2019-12-14 NOTE — Telephone Encounter (Signed)
Patient returned call; appointment has been scheduled.

## 2019-12-20 ENCOUNTER — Encounter: Payer: Self-pay | Admitting: Orthopaedic Surgery

## 2019-12-20 ENCOUNTER — Ambulatory Visit: Payer: Medicare HMO | Admitting: Orthopaedic Surgery

## 2019-12-20 ENCOUNTER — Other Ambulatory Visit: Payer: Self-pay

## 2019-12-20 ENCOUNTER — Ambulatory Visit: Payer: Medicare HMO

## 2019-12-20 VITALS — Temp 97.7°F | Ht <= 58 in | Wt 133.0 lb

## 2019-12-20 DIAGNOSIS — G8929 Other chronic pain: Secondary | ICD-10-CM

## 2019-12-20 DIAGNOSIS — M25562 Pain in left knee: Secondary | ICD-10-CM

## 2019-12-20 DIAGNOSIS — M25531 Pain in right wrist: Secondary | ICD-10-CM

## 2019-12-20 DIAGNOSIS — M25561 Pain in right knee: Secondary | ICD-10-CM

## 2019-12-20 NOTE — Progress Notes (Signed)
Patient [id number] Cindy Mantis., female DOB:1941/02/13, 79 y.o. [id number]  Chief Complaint  Patient presents with  . Knee Pain    Chronic pain of both knees    HPI  Cindy Depena. is a 79 y.o. female who has bilateral knee pain.  She has swelling and popping but no giving way, no redness, no trauma.  She also has a new problem:  Pain of the right dominant thumb at the base.  She has pain holding things.  She did some painting recently and the thumb is more painful now.  She has no redness, no swelling, no trauma.   Body mass index is 28.53 kg/m.  ROS  Review of Systems  Constitutional: Positive for activity change.  Musculoskeletal: Positive for arthralgias, gait problem and joint swelling.  Psychiatric/Behavioral: The patient is nervous/anxious.   All other systems reviewed and are negative.   All other systems reviewed and are negative.  The following is a summary of the past history medically, past history surgically, known current medicines, social history and family history.  This information is gathered electronically by the computer from prior information and documentation.  I review this each visit and have found including this information at this point in the chart is beneficial and informative.    Past Medical History:  Diagnosis Date  . Abnormal Pap smear   . Anxiety   . Cancer ([place])    skin  . Depression   . Hypercholesteremia 11/12/2012  . Hypertension   . Hyperthyroidism 11/12/2012  . Rectocele 11/12/2012  . Thyroid disease   . Urge incontinence 11/12/2012  . Vaginal Pap smear, abnormal     Past Surgical History:  Procedure Laterality Date  . ABDOMINAL HYSTERECTOMY    . bladder and bowel     mesh put in  . BLADDER SURGERY     sling x2  . EYE SURGERY      Family History  Problem Relation Age of Onset  . Hypertension Mother   . Coronary artery disease Mother   . Diabetes Brother   . Cancer Paternal Grandmother        breast  . Diabetes Maternal  Grandfather   . COPD Father   . Coronary artery disease Brother   . Diabetes Maternal Uncle     Social History Social History   Tobacco Use  . Smoking status: Former Smoker    Types: Cigarettes    Quit date: 08/18/1974    Years since quitting: 45.3  . Smokeless tobacco: Never Used  Substance Use Topics  . Alcohol use: Yes    Comment: wine rarely  . Drug use: No    No Known Allergies  Current Outpatient Medications  Medication Sig Dispense Refill  . Acetaminophen (TYLENOL 8 HOUR PO) Take 500 mg by mouth.    Marland Kitchen aspirin 81 MG tablet Take 81 mg by mouth daily.    Marland Kitchen atenolol (TENORMIN) 50 MG tablet Take 50 mg by mouth daily.     Marland Kitchen escitalopram (LEXAPRO) 20 MG tablet every morning before breakfast.    . estradiol (ESTRACE) 0.1 MG/GM vaginal cream Use  2x weekly 0.5gms in vagina 42.5 g 1  . fluticasone (FLONASE) 50 MCG/ACT nasal spray SPRAY 2 SPRAYS INTO EACH NOSTRIL TWICE DAILY FOR 7 DAYS; THEN USE ONCE DAILY AS NEEDED. 16 g PRN  . lisinopril (ZESTRIL) 20 MG tablet 20 mg daily.    Marland Kitchen lovastatin (MEVACOR) 10 MG tablet Take 20 mg by mouth at bedtime.     Marland Kitchen  meloxicam (MOBIC) 15 MG tablet Take 15 mg by mouth daily.     . methimazole (TAPAZOLE) 5 MG tablet Take 5 mg by mouth daily. One daily on Monday Wednesday and Fridays and two on Saturdays    . pantoprazole (PROTONIX) 40 MG tablet Take 40 mg by mouth.     . zolpidem (AMBIEN) 10 MG tablet Take 10 mg by mouth at bedtime as needed for sleep.     No current facility-administered medications for this visit.     Physical Exam  Temperature 97.7 F (36.5 C), height 4' 9.25" (1.454 m), weight 133 lb (60.3 kg).  Constitutional: overall normal hygiene, normal nutrition, well developed, normal grooming, normal body habitus. Assistive device:none  Musculoskeletal: gait and station Limp left, muscle tone and strength are normal, no tremors or atrophy is present.  .  Neurological: coordination overall normal.  Deep tendon reflex/nerve  stretch intact.  Sensation normal.  Cranial nerves II-XII intact.   Skin:   Normal overall no scars, lesions, ulcers or rashes. No psoriasis.  Psychiatric: Alert and oriented x 3.  Recent memory intact, remote memory unclear.  Normal mood and affect. Well groomed.  Good eye contact.  Cardiovascular: overall no swelling, no varicosities, no edema bilaterally, normal temperatures of the legs and arms, no clubbing, cyanosis and good capillary refill.  Lymphatic: palpation is normal.  Both knees are tender, ROM left 0 to 105, right 0 to 110, limp left, effusion, crepitus, stable.  NV intact.  Right thumb base is tender with positive grind test.  She has some crepitus.  NV intact. ROM is full.  X-rays of the right wrist were done, reported separately.  All other systems reviewed and are negative   The patient has been educated about the nature of the problem(s) and counseled on treatment options.  The patient appeared to understand what I have discussed and is in agreement with it.  Encounter Diagnoses  Name Primary?  . Chronic pain of both knees   . Right wrist pain Yes   PROCEDURE NOTE:  The patient requests injections of the left knee , verbal consent was obtained.  The left knee was prepped appropriately after time out was performed.   Sterile technique was observed and injection of 1 cc of Depo-Medrol 40 mg with several cc's of plain xylocaine. Anesthesia was provided by ethyl chloride and a 20-gauge needle was used to inject the knee area. The injection was tolerated well.  A band aid dressing was applied.  The patient was advised to apply ice later today and tomorrow to the injection sight as needed.  PROCEDURE NOTE:  The patient requests injections of the right knee , verbal consent was obtained.  The right knee was prepped appropriately after time out was performed.   Sterile technique was observed and injection of 1 cc of Depo-Medrol 40 mg with several cc's of plain  xylocaine. Anesthesia was provided by ethyl chloride and a 20-gauge needle was used to inject the knee area. The injection was tolerated well.  A band aid dressing was applied.  The patient was advised to apply ice later today and tomorrow to the injection sight as needed.  I have recommended Aspercreme or Voltaren gel for the thumb and a splint.  She has a splint already.  PLAN Call if any problems.  Precautions discussed.  Continue current medications.   Return to clinic prn   Electronically Signed Sanjuana Kava, MD 5/4/202111:47 AM

## 2020-01-02 ENCOUNTER — Ambulatory Visit (HOSPITAL_COMMUNITY): Payer: Medicare HMO

## 2020-01-03 ENCOUNTER — Other Ambulatory Visit (HOSPITAL_COMMUNITY): Payer: Medicare HMO

## 2020-01-19 ENCOUNTER — Ambulatory Visit (HOSPITAL_COMMUNITY): Payer: Medicare HMO

## 2020-01-19 ENCOUNTER — Encounter (HOSPITAL_COMMUNITY): Payer: Self-pay

## 2020-01-24 ENCOUNTER — Encounter: Payer: Self-pay | Admitting: Orthopaedic Surgery

## 2020-01-24 ENCOUNTER — Ambulatory Visit (HOSPITAL_COMMUNITY)
Admission: RE | Admit: 2020-01-24 | Discharge: 2020-01-24 | Disposition: A | Discharge status: Home / Self Care | Payer: Medicare HMO | Source: Ambulatory Visit | Attending: Internal Medicine | Admitting: Internal Medicine

## 2020-01-24 ENCOUNTER — Ambulatory Visit: Payer: Medicare HMO

## 2020-01-24 ENCOUNTER — Other Ambulatory Visit: Payer: Self-pay

## 2020-01-24 ENCOUNTER — Ambulatory Visit: Payer: Medicare HMO | Admitting: Orthopaedic Surgery

## 2020-01-24 VITALS — BP 147/85 | HR 63 | Ht <= 58 in | Wt 136.0 lb

## 2020-01-24 DIAGNOSIS — G8929 Other chronic pain: Secondary | ICD-10-CM | POA: Diagnosis not present

## 2020-01-24 DIAGNOSIS — M50221 Other cervical disc displacement at C4-C5 level: Secondary | ICD-10-CM | POA: Diagnosis not present

## 2020-01-24 DIAGNOSIS — M25551 Pain in right hip: Secondary | ICD-10-CM

## 2020-01-24 DIAGNOSIS — M542 Cervicalgia: Secondary | ICD-10-CM | POA: Diagnosis not present

## 2020-01-24 DIAGNOSIS — M4802 Spinal stenosis, cervical region: Secondary | ICD-10-CM | POA: Diagnosis not present

## 2020-01-24 DIAGNOSIS — M25561 Pain in right knee: Secondary | ICD-10-CM | POA: Diagnosis not present

## 2020-01-24 MED ORDER — MELOXICAM 15 MG PO TABS: 15.0000 mg | ORAL_TABLET | Freq: Every day | ORAL | 5 refills | Status: DC

## 2020-01-24 MED ORDER — GADOBUTROL 1 MMOL/ML IV SOLN
6.0000 mL | Freq: Once | INTRAVENOUS | Status: AC | PRN
  Administered 2020-01-24: 6 mL via INTRAVENOUS

## 2020-01-24 NOTE — Progress Notes (Signed)
Patient Cindy Hopkins., female DOB:[birth year], 79 y.o. [id number]  Chief Complaint  Patient presents with  . Knee Pain    right    HPI  Cindy Hopkins is a 79 y.o. female who has recurrent right knee pain.  She has swelling and popping.  She has no new trauma or redness.  She wants an injection.  She has pain of the right hip.  It hurts when first walking. She has no trauma.     Body mass index is 28.42 kg/m.  ROS  Review of Systems  Constitutional: Positive for activity change.  Musculoskeletal: Positive for arthralgias, gait problem and joint swelling.  Psychiatric/Behavioral: The patient is nervous/anxious.   All other systems reviewed and are negative.   All other systems reviewed and are negative.  The following is a summary of the past history medically, past history surgically, known current medicines, social history and family history.  This information is gathered electronically by the computer from prior information and documentation.  I review this each visit and have found including this information at this point in the chart is beneficial and informative.    Past Medical History:  Diagnosis Date  . Abnormal Pap smear   . Anxiety   . Cancer ([place])    skin  . Depression   . Hypercholesteremia 11/12/2012  . Hypertension   . Hyperthyroidism 11/12/2012  . Rectocele 11/12/2012  . Thyroid disease   . Urge incontinence 11/12/2012  . Vaginal Pap smear, abnormal     Past Surgical History:  Procedure Laterality Date  . ABDOMINAL HYSTERECTOMY    . bladder and bowel     mesh put in  . BLADDER SURGERY     sling x2  . EYE SURGERY      Family History  Problem Relation Age of Onset  . Hypertension Mother   . Coronary artery disease Mother   . Diabetes Brother   . Cancer Paternal Grandmother        breast  . Diabetes Maternal Grandfather   . COPD Father   . Coronary artery disease Brother   . Diabetes Maternal Uncle     Social History Social History    Tobacco Use  . Smoking status: Former Smoker    Types: Cigarettes    Quit date: 08/18/1974    Years since quitting: 45.4  . Smokeless tobacco: Never Used  Substance Use Topics  . Alcohol use: Yes    Comment: wine rarely  . Drug use: No    No Known Allergies  Current Outpatient Medications  Medication Sig Dispense Refill  . Acetaminophen (TYLENOL 8 HOUR PO) Take 500 mg by mouth.    Marland Kitchen aspirin 81 MG tablet Take 81 mg by mouth daily.    Marland Kitchen atenolol (TENORMIN) 50 MG tablet Take 50 mg by mouth daily.     Marland Kitchen escitalopram (LEXAPRO) 20 MG tablet every morning before breakfast.    . estradiol (ESTRACE) 0.1 MG/GM vaginal cream Use  2x weekly 0.5gms in vagina 42.5 g 1  . fluticasone (FLONASE) 50 MCG/ACT nasal spray SPRAY 2 SPRAYS INTO EACH NOSTRIL TWICE DAILY FOR 7 DAYS; THEN USE ONCE DAILY AS NEEDED. 16 g PRN  . lisinopril (ZESTRIL) 20 MG tablet 20 mg daily.    Marland Kitchen lovastatin (MEVACOR) 10 MG tablet Take 20 mg by mouth at bedtime.     . meloxicam (MOBIC) 15 MG tablet Take 15 mg by mouth daily.     . methimazole (TAPAZOLE) 5 MG tablet Take  5 mg by mouth daily. One daily on Monday Wednesday and Fridays and two on Saturdays    . pantoprazole (PROTONIX) 40 MG tablet Take 40 mg by mouth.     . zolpidem (AMBIEN) 10 MG tablet Take 10 mg by mouth at bedtime as needed for sleep.     No current facility-administered medications for this visit.     Physical Exam  Blood pressure (!) 147/85, pulse 63, height 4\' 10"  (1.473 m), weight 136 lb (61.7 kg).  Constitutional: overall normal hygiene, normal nutrition, well developed, normal grooming, normal body habitus. Assistive device:none  Musculoskeletal: gait and station Limp right, muscle tone and strength are normal, no tremors or atrophy is present.  .  Neurological: coordination overall normal.  Deep tendon reflex/nerve stretch intact.  Sensation normal.  Cranial nerves II-XII intact.   Skin:   Normal overall no scars, lesions, ulcers or rashes. No  psoriasis.  Psychiatric: Alert and oriented x 3.  Recent memory intact, remote memory unclear.  Normal mood and affect. Well groomed.  Good eye contact.  Cardiovascular: overall no swelling, no varicosities, no edema bilaterally, normal temperatures of the legs and arms, no clubbing, cyanosis and good capillary refill.  Lymphatic: palpation is normal.  Right hip has full ROM, no crepitus.  NV intact.  Right knee has effusion, crepitus, ROM 0 to 105, slight limp right, NV intact, knee stable.  All other systems reviewed and are negative   The patient has been educated about the nature of the problem(s) and counseled on treatment options.  The patient appeared to understand what I have discussed and is in agreement with it.  Encounter Diagnoses  Name Primary?  . Pain in right hip   . Chronic pain of right knee Yes   PROCEDURE NOTE:  The patient requests injections of the right knee , verbal consent was obtained.  The right knee was prepped appropriately after time out was performed.   Sterile technique was observed and injection of 1 cc of Depo-Medrol 40 mg with several cc's of plain xylocaine. Anesthesia was provided by ethyl chloride and a 20-gauge needle was used to inject the knee area. The injection was tolerated well.  A band aid dressing was applied.  The patient was advised to apply ice later today and tomorrow to the injection sight as needed.  PLAN Call if any problems.  Precautions discussed.  Continue current medications. I will call in mobic Rx refill.  Return to clinic prn   Electronically Signed Sanjuana Kava, MD 6/8/20211:53 PM

## 2020-01-31 DIAGNOSIS — L820 Inflamed seborrheic keratosis: Secondary | ICD-10-CM | POA: Diagnosis not present

## 2020-01-31 DIAGNOSIS — D225 Melanocytic nevi of trunk: Secondary | ICD-10-CM | POA: Diagnosis not present

## 2020-03-01 ENCOUNTER — Ambulatory Visit: Payer: Medicare HMO | Admitting: Orthopaedic Surgery

## 2020-03-01 ENCOUNTER — Other Ambulatory Visit: Payer: Self-pay

## 2020-03-01 ENCOUNTER — Encounter: Payer: Self-pay | Admitting: Orthopaedic Surgery

## 2020-03-01 VITALS — Ht <= 58 in | Wt 136.0 lb

## 2020-03-01 DIAGNOSIS — G8929 Other chronic pain: Secondary | ICD-10-CM | POA: Diagnosis not present

## 2020-03-01 DIAGNOSIS — M25561 Pain in right knee: Secondary | ICD-10-CM

## 2020-03-01 DIAGNOSIS — M25562 Pain in left knee: Secondary | ICD-10-CM

## 2020-03-01 NOTE — Progress Notes (Signed)
PROCEDURE NOTE:  The patient requests injections of the left knee , verbal consent was obtained.  The left knee was prepped appropriately after time out was performed.   Sterile technique was observed and injection of 1 cc of Depo-Medrol 40 mg with several cc's of plain xylocaine. Anesthesia was provided by ethyl chloride and a 20-gauge needle was used to inject the knee area. The injection was tolerated well.  A band aid dressing was applied.  The patient was advised to apply ice later today and tomorrow to the injection sight as needed.  PROCEDURE NOTE:  The patient requests injections of the right knee , verbal consent was obtained.  The right knee was prepped appropriately after time out was performed.   Sterile technique was observed and injection of 1 cc of Depo-Medrol 40 mg with several cc's of plain xylocaine. Anesthesia was provided by ethyl chloride and a 20-gauge needle was used to inject the knee area. The injection was tolerated well.  A band aid dressing was applied.  The patient was advised to apply ice later today and tomorrow to the injection sight as needed.  Return in one month.  Electronically Signed Sanjuana Kava, MD 7/15/202110:06 AM

## 2020-03-05 DIAGNOSIS — K219 Gastro-esophageal reflux disease without esophagitis: Secondary | ICD-10-CM | POA: Diagnosis not present

## 2020-03-05 DIAGNOSIS — E039 Hypothyroidism, unspecified: Secondary | ICD-10-CM | POA: Diagnosis not present

## 2020-03-05 DIAGNOSIS — M791 Myalgia, unspecified site: Secondary | ICD-10-CM | POA: Diagnosis not present

## 2020-03-05 DIAGNOSIS — I10 Essential (primary) hypertension: Secondary | ICD-10-CM | POA: Diagnosis not present

## 2020-03-05 DIAGNOSIS — F339 Major depressive disorder, recurrent, unspecified: Secondary | ICD-10-CM | POA: Diagnosis not present

## 2020-03-05 DIAGNOSIS — M1990 Unspecified osteoarthritis, unspecified site: Secondary | ICD-10-CM | POA: Diagnosis not present

## 2020-03-05 DIAGNOSIS — I519 Heart disease, unspecified: Secondary | ICD-10-CM | POA: Diagnosis not present

## 2020-03-05 DIAGNOSIS — E785 Hyperlipidemia, unspecified: Secondary | ICD-10-CM | POA: Diagnosis not present

## 2020-03-05 DIAGNOSIS — F5101 Primary insomnia: Secondary | ICD-10-CM | POA: Diagnosis not present

## 2020-03-06 ENCOUNTER — Other Ambulatory Visit: Payer: Self-pay | Admitting: Adult Health

## 2020-03-06 NOTE — Addendum Note (Signed)
Addended by: Janece Canterbury on: 03/06/2020 05:07 PM   Modules accepted: Orders

## 2020-03-06 NOTE — Telephone Encounter (Signed)
Pt is using [facility] she is needed refill called in for vaginal cream Estrace needs asap / Dr halls office said she needs ASAP . [phone]/ Kasie/ [facility] ( I am not familiar with this pharmacy)

## 2020-03-07 MED ORDER — ESTRADIOL 0.1 MG/GM VA CREA: TOPICAL_CREAM | VAGINAL | 1 refills | Status: DC

## 2020-03-29 ENCOUNTER — Encounter: Payer: Self-pay | Admitting: Orthopaedic Surgery

## 2020-03-29 ENCOUNTER — Other Ambulatory Visit: Payer: Self-pay

## 2020-03-29 ENCOUNTER — Ambulatory Visit: Payer: Medicare HMO | Admitting: Orthopaedic Surgery

## 2020-03-29 VITALS — Ht <= 58 in | Wt 136.0 lb

## 2020-03-29 DIAGNOSIS — I519 Heart disease, unspecified: Secondary | ICD-10-CM | POA: Diagnosis not present

## 2020-03-29 DIAGNOSIS — M25561 Pain in right knee: Secondary | ICD-10-CM

## 2020-03-29 DIAGNOSIS — F5101 Primary insomnia: Secondary | ICD-10-CM | POA: Diagnosis not present

## 2020-03-29 DIAGNOSIS — M791 Myalgia, unspecified site: Secondary | ICD-10-CM | POA: Diagnosis not present

## 2020-03-29 DIAGNOSIS — K219 Gastro-esophageal reflux disease without esophagitis: Secondary | ICD-10-CM | POA: Diagnosis not present

## 2020-03-29 DIAGNOSIS — E785 Hyperlipidemia, unspecified: Secondary | ICD-10-CM | POA: Diagnosis not present

## 2020-03-29 DIAGNOSIS — G8929 Other chronic pain: Secondary | ICD-10-CM

## 2020-03-29 DIAGNOSIS — I10 Essential (primary) hypertension: Secondary | ICD-10-CM | POA: Diagnosis not present

## 2020-03-29 DIAGNOSIS — M25562 Pain in left knee: Secondary | ICD-10-CM

## 2020-03-29 DIAGNOSIS — E039 Hypothyroidism, unspecified: Secondary | ICD-10-CM | POA: Diagnosis not present

## 2020-03-29 DIAGNOSIS — F339 Major depressive disorder, recurrent, unspecified: Secondary | ICD-10-CM | POA: Diagnosis not present

## 2020-03-29 DIAGNOSIS — M1990 Unspecified osteoarthritis, unspecified site: Secondary | ICD-10-CM | POA: Diagnosis not present

## 2020-03-29 NOTE — Progress Notes (Signed)
PROCEDURE NOTE:  The patient requests injections of the right knee , verbal consent was obtained.  The right knee was prepped appropriately after time out was performed.   Sterile technique was observed and injection of 1 cc of Depo-Medrol 40 mg with several cc's of plain xylocaine. Anesthesia was provided by ethyl chloride and a 20-gauge needle was used to inject the knee area. The injection was tolerated well.  A band aid dressing was applied.  The patient was advised to apply ice later today and tomorrow to the injection sight as needed.  PROCEDURE NOTE:  The patient requests injections of the left knee , verbal consent was obtained.  The left knee was prepped appropriately after time out was performed.   Sterile technique was observed and injection of 1 cc of Depo-Medrol 40 mg with several cc's of plain xylocaine. Anesthesia was provided by ethyl chloride and a 20-gauge needle was used to inject the knee area. The injection was tolerated well.  A band aid dressing was applied.  The patient was advised to apply ice later today and tomorrow to the injection sight as needed.  Return in one month.  Call if any problem.  Precautions discussed.   Electronically Signed Sanjuana Kava, MD 8/12/20219:49 AM

## 2020-04-03 DIAGNOSIS — I519 Heart disease, unspecified: Secondary | ICD-10-CM | POA: Diagnosis not present

## 2020-04-03 DIAGNOSIS — E785 Hyperlipidemia, unspecified: Secondary | ICD-10-CM | POA: Diagnosis not present

## 2020-04-03 DIAGNOSIS — Z9181 History of falling: Secondary | ICD-10-CM | POA: Diagnosis not present

## 2020-04-03 DIAGNOSIS — H02403 Unspecified ptosis of bilateral eyelids: Secondary | ICD-10-CM | POA: Diagnosis not present

## 2020-04-03 DIAGNOSIS — N952 Postmenopausal atrophic vaginitis: Secondary | ICD-10-CM | POA: Diagnosis not present

## 2020-04-03 DIAGNOSIS — F5101 Primary insomnia: Secondary | ICD-10-CM | POA: Diagnosis not present

## 2020-04-03 DIAGNOSIS — Z0000 Encounter for general adult medical examination without abnormal findings: Secondary | ICD-10-CM | POA: Diagnosis not present

## 2020-04-03 DIAGNOSIS — K219 Gastro-esophageal reflux disease without esophagitis: Secondary | ICD-10-CM | POA: Diagnosis not present

## 2020-04-03 DIAGNOSIS — R7301 Impaired fasting glucose: Secondary | ICD-10-CM | POA: Diagnosis not present

## 2020-04-03 DIAGNOSIS — R42 Dizziness and giddiness: Secondary | ICD-10-CM | POA: Diagnosis not present

## 2020-04-09 DIAGNOSIS — Z0001 Encounter for general adult medical examination with abnormal findings: Secondary | ICD-10-CM | POA: Diagnosis not present

## 2020-04-09 DIAGNOSIS — F5101 Primary insomnia: Secondary | ICD-10-CM | POA: Diagnosis not present

## 2020-04-09 DIAGNOSIS — I6529 Occlusion and stenosis of unspecified carotid artery: Secondary | ICD-10-CM | POA: Diagnosis not present

## 2020-04-09 DIAGNOSIS — M79606 Pain in leg, unspecified: Secondary | ICD-10-CM | POA: Diagnosis not present

## 2020-04-09 DIAGNOSIS — F39 Unspecified mood [affective] disorder: Secondary | ICD-10-CM | POA: Diagnosis not present

## 2020-04-09 DIAGNOSIS — I10 Essential (primary) hypertension: Secondary | ICD-10-CM | POA: Diagnosis not present

## 2020-04-09 DIAGNOSIS — E782 Mixed hyperlipidemia: Secondary | ICD-10-CM | POA: Diagnosis not present

## 2020-04-09 DIAGNOSIS — E039 Hypothyroidism, unspecified: Secondary | ICD-10-CM | POA: Diagnosis not present

## 2020-04-09 DIAGNOSIS — M25569 Pain in unspecified knee: Secondary | ICD-10-CM | POA: Diagnosis not present

## 2020-04-09 DIAGNOSIS — K219 Gastro-esophageal reflux disease without esophagitis: Secondary | ICD-10-CM | POA: Diagnosis not present

## 2020-04-19 DIAGNOSIS — M5010 Cervical disc disorder with radiculopathy, unspecified cervical region: Secondary | ICD-10-CM | POA: Diagnosis not present

## 2020-04-26 ENCOUNTER — Ambulatory Visit: Payer: Medicare HMO | Admitting: Orthopaedic Surgery

## 2020-04-26 ENCOUNTER — Other Ambulatory Visit: Payer: Self-pay

## 2020-04-26 ENCOUNTER — Encounter: Payer: Self-pay | Admitting: Orthopaedic Surgery

## 2020-04-26 DIAGNOSIS — M25561 Pain in right knee: Secondary | ICD-10-CM | POA: Diagnosis not present

## 2020-04-26 DIAGNOSIS — M25562 Pain in left knee: Secondary | ICD-10-CM

## 2020-04-26 DIAGNOSIS — G8929 Other chronic pain: Secondary | ICD-10-CM

## 2020-04-26 NOTE — Progress Notes (Signed)
PROCEDURE NOTE:  The patient requests injections of the left knee , verbal consent was obtained.  The left knee was prepped appropriately after time out was performed.   Sterile technique was observed and injection of 1 cc of Depo-Medrol 40 mg with several cc's of plain xylocaine. Anesthesia was provided by ethyl chloride and a 20-gauge needle was used to inject the knee area. The injection was tolerated well.  A band aid dressing was applied.  The patient was advised to apply ice later today and tomorrow to the injection sight as needed.  PROCEDURE NOTE:  The patient requests injections of the right knee , verbal consent was obtained.  The right knee was prepped appropriately after time out was performed.   Sterile technique was observed and injection of 1 cc of Depo-Medrol 40 mg with several cc's of plain xylocaine. Anesthesia was provided by ethyl chloride and a 20-gauge needle was used to inject the knee area. The injection was tolerated well.  A band aid dressing was applied.  The patient was advised to apply ice later today and tomorrow to the injection sight as needed.  Return in one month.  Call if any problem.  Precautions discussed.   Electronically Signed Sanjuana Kava, MD 9/9/202110:24 AM

## 2020-05-08 DIAGNOSIS — M542 Cervicalgia: Secondary | ICD-10-CM | POA: Diagnosis not present

## 2020-05-08 DIAGNOSIS — M4722 Other spondylosis with radiculopathy, cervical region: Secondary | ICD-10-CM | POA: Diagnosis not present

## 2020-05-08 DIAGNOSIS — M4312 Spondylolisthesis, cervical region: Secondary | ICD-10-CM | POA: Diagnosis not present

## 2020-05-09 DIAGNOSIS — E7849 Other hyperlipidemia: Secondary | ICD-10-CM | POA: Diagnosis not present

## 2020-05-09 DIAGNOSIS — M1990 Unspecified osteoarthritis, unspecified site: Secondary | ICD-10-CM | POA: Diagnosis not present

## 2020-05-09 DIAGNOSIS — I10 Essential (primary) hypertension: Secondary | ICD-10-CM | POA: Diagnosis not present

## 2020-05-09 DIAGNOSIS — E039 Hypothyroidism, unspecified: Secondary | ICD-10-CM | POA: Diagnosis not present

## 2020-05-24 ENCOUNTER — Ambulatory Visit: Payer: Medicare HMO | Admitting: Orthopaedic Surgery

## 2020-05-24 ENCOUNTER — Other Ambulatory Visit: Payer: Self-pay

## 2020-05-24 ENCOUNTER — Encounter: Payer: Self-pay | Admitting: Orthopaedic Surgery

## 2020-05-24 VITALS — Ht <= 58 in | Wt 136.0 lb

## 2020-05-24 DIAGNOSIS — M25562 Pain in left knee: Secondary | ICD-10-CM

## 2020-05-24 DIAGNOSIS — G8929 Other chronic pain: Secondary | ICD-10-CM | POA: Diagnosis not present

## 2020-05-24 DIAGNOSIS — M25561 Pain in right knee: Secondary | ICD-10-CM

## 2020-05-24 NOTE — Progress Notes (Signed)
PROCEDURE NOTE:  The patient requests injections of the left knee , verbal consent was obtained.  The left knee was prepped appropriately after time out was performed.   Sterile technique was observed and injection of 1 cc of Depo-Medrol 40 mg with several cc's of plain xylocaine. Anesthesia was provided by ethyl chloride and a 20-gauge needle was used to inject the knee area. The injection was tolerated well.  A band aid dressing was applied.  The patient was advised to apply ice later today and tomorrow to the injection sight as needed.  PROCEDURE NOTE:  The patient requests injections of the right knee , verbal consent was obtained.  The right knee was prepped appropriately after time out was performed.   Sterile technique was observed and injection of 1 cc of Depo-Medrol 40 mg with several cc's of plain xylocaine. Anesthesia was provided by ethyl chloride and a 20-gauge needle was used to inject the knee area. The injection was tolerated well.  A band aid dressing was applied.  The patient was advised to apply ice later today and tomorrow to the injection sight as needed.  Return in one month.  Electronically Signed Sanjuana Kava, MD 10/7/202110:32 AM

## 2020-05-31 DIAGNOSIS — M5010 Cervical disc disorder with radiculopathy, unspecified cervical region: Secondary | ICD-10-CM | POA: Diagnosis not present

## 2020-05-31 DIAGNOSIS — M25561 Pain in right knee: Secondary | ICD-10-CM | POA: Diagnosis not present

## 2020-05-31 DIAGNOSIS — Z23 Encounter for immunization: Secondary | ICD-10-CM | POA: Diagnosis not present

## 2020-05-31 DIAGNOSIS — Z634 Disappearance and death of family member: Secondary | ICD-10-CM | POA: Diagnosis not present

## 2020-05-31 DIAGNOSIS — M25562 Pain in left knee: Secondary | ICD-10-CM | POA: Diagnosis not present

## 2020-06-11 DIAGNOSIS — M542 Cervicalgia: Secondary | ICD-10-CM | POA: Diagnosis not present

## 2020-06-12 DIAGNOSIS — M25561 Pain in right knee: Secondary | ICD-10-CM | POA: Diagnosis not present

## 2020-06-12 DIAGNOSIS — K219 Gastro-esophageal reflux disease without esophagitis: Secondary | ICD-10-CM | POA: Diagnosis not present

## 2020-06-12 DIAGNOSIS — Z634 Disappearance and death of family member: Secondary | ICD-10-CM | POA: Diagnosis not present

## 2020-06-12 DIAGNOSIS — M25562 Pain in left knee: Secondary | ICD-10-CM | POA: Diagnosis not present

## 2020-06-12 DIAGNOSIS — M5010 Cervical disc disorder with radiculopathy, unspecified cervical region: Secondary | ICD-10-CM | POA: Diagnosis not present

## 2020-06-12 DIAGNOSIS — Z23 Encounter for immunization: Secondary | ICD-10-CM | POA: Diagnosis not present

## 2020-06-12 DIAGNOSIS — I10 Essential (primary) hypertension: Secondary | ICD-10-CM | POA: Diagnosis not present

## 2020-06-12 DIAGNOSIS — E7849 Other hyperlipidemia: Secondary | ICD-10-CM | POA: Diagnosis not present

## 2020-06-19 DIAGNOSIS — M542 Cervicalgia: Secondary | ICD-10-CM | POA: Diagnosis not present

## 2020-06-25 DIAGNOSIS — M542 Cervicalgia: Secondary | ICD-10-CM | POA: Diagnosis not present

## 2020-06-26 ENCOUNTER — Other Ambulatory Visit: Payer: Self-pay

## 2020-06-26 ENCOUNTER — Encounter: Payer: Self-pay | Admitting: Orthopaedic Surgery

## 2020-06-26 ENCOUNTER — Ambulatory Visit: Payer: Medicare HMO | Admitting: Orthopaedic Surgery

## 2020-06-26 DIAGNOSIS — M25562 Pain in left knee: Secondary | ICD-10-CM

## 2020-06-26 DIAGNOSIS — M25561 Pain in right knee: Secondary | ICD-10-CM | POA: Diagnosis not present

## 2020-06-26 DIAGNOSIS — G8929 Other chronic pain: Secondary | ICD-10-CM

## 2020-06-26 NOTE — Progress Notes (Signed)
PROCEDURE NOTE:  The patient requests injections of the right knee , verbal consent was obtained.  The right knee was prepped appropriately after time out was performed.   Sterile technique was observed and injection of 1 cc of Depo-Medrol 40 mg with several cc's of plain xylocaine. Anesthesia was provided by ethyl chloride and a 20-gauge needle was used to inject the knee area. The injection was tolerated well.  A band aid dressing was applied.  The patient was advised to apply ice later today and tomorrow to the injection sight as needed.  PROCEDURE NOTE:  The patient requests injections of the left knee , verbal consent was obtained.  The left knee was prepped appropriately after time out was performed.   Sterile technique was observed and injection of 1 cc of Depo-Medrol 40 mg with several cc's of plain xylocaine. Anesthesia was provided by ethyl chloride and a 20-gauge needle was used to inject the knee area. The injection was tolerated well.  A band aid dressing was applied.  The patient was advised to apply ice later today and tomorrow to the injection sight as needed.  Return in one month.  Electronically [place], MD 11/9/20212:27 PM

## 2020-06-28 DIAGNOSIS — M542 Cervicalgia: Secondary | ICD-10-CM | POA: Diagnosis not present

## 2020-07-02 DIAGNOSIS — M542 Cervicalgia: Secondary | ICD-10-CM | POA: Diagnosis not present

## 2020-07-05 DIAGNOSIS — M542 Cervicalgia: Secondary | ICD-10-CM | POA: Diagnosis not present

## 2020-07-09 DIAGNOSIS — M542 Cervicalgia: Secondary | ICD-10-CM | POA: Diagnosis not present

## 2020-07-11 DIAGNOSIS — E7849 Other hyperlipidemia: Secondary | ICD-10-CM | POA: Diagnosis not present

## 2020-07-11 DIAGNOSIS — Z634 Disappearance and death of family member: Secondary | ICD-10-CM | POA: Diagnosis not present

## 2020-07-11 DIAGNOSIS — M25561 Pain in right knee: Secondary | ICD-10-CM | POA: Diagnosis not present

## 2020-07-11 DIAGNOSIS — Z23 Encounter for immunization: Secondary | ICD-10-CM | POA: Diagnosis not present

## 2020-07-11 DIAGNOSIS — M5010 Cervical disc disorder with radiculopathy, unspecified cervical region: Secondary | ICD-10-CM | POA: Diagnosis not present

## 2020-07-11 DIAGNOSIS — M25562 Pain in left knee: Secondary | ICD-10-CM | POA: Diagnosis not present

## 2020-07-11 DIAGNOSIS — I10 Essential (primary) hypertension: Secondary | ICD-10-CM | POA: Diagnosis not present

## 2020-07-11 DIAGNOSIS — K219 Gastro-esophageal reflux disease without esophagitis: Secondary | ICD-10-CM | POA: Diagnosis not present

## 2020-07-23 DIAGNOSIS — M542 Cervicalgia: Secondary | ICD-10-CM | POA: Diagnosis not present

## 2020-07-24 ENCOUNTER — Other Ambulatory Visit: Payer: Self-pay

## 2020-07-24 ENCOUNTER — Encounter: Payer: Self-pay | Admitting: Orthopaedic Surgery

## 2020-07-24 ENCOUNTER — Ambulatory Visit: Payer: Medicare HMO | Admitting: Orthopaedic Surgery

## 2020-07-24 DIAGNOSIS — M25561 Pain in right knee: Secondary | ICD-10-CM

## 2020-07-24 DIAGNOSIS — M25562 Pain in left knee: Secondary | ICD-10-CM

## 2020-07-24 DIAGNOSIS — G8929 Other chronic pain: Secondary | ICD-10-CM

## 2020-07-24 NOTE — Progress Notes (Addendum)
PROCEDURE NOTE:  The patient requests injections of the right knee , verbal consent was obtained.  The right knee was prepped appropriately after time out was performed.   Sterile technique was observed and injection of 1 cc of Depo-Medrol 40 mg with several cc's of plain xylocaine. Anesthesia was provided by ethyl chloride and a 20-gauge needle was used to inject the knee area. The injection was tolerated well.  A band aid dressing was applied.  The patient was advised to apply ice later today and tomorrow to the injection sight as needed.  PROCEDURE NOTE:  The patient requests injections of the left knee , verbal consent was obtained.  The left knee was prepped appropriately after time out was performed.   Sterile technique was observed and injection of 1 cc of Depo-Medrol 40 mg with several cc's of plain xylocaine. Anesthesia was provided by ethyl chloride and a 20-gauge needle was used to inject the knee area. The injection was tolerated well.  A band aid dressing was applied.  The patient was advised to apply ice later today and tomorrow to the injection sight as needed.  Return in one month.  Electronically Signed Sanjuana Kava, MD 12/7/202110:45 AM

## 2020-07-30 DIAGNOSIS — M542 Cervicalgia: Secondary | ICD-10-CM | POA: Diagnosis not present

## 2020-08-06 DIAGNOSIS — M542 Cervicalgia: Secondary | ICD-10-CM | POA: Diagnosis not present

## 2020-08-13 DIAGNOSIS — M542 Cervicalgia: Secondary | ICD-10-CM | POA: Diagnosis not present

## 2020-08-17 DIAGNOSIS — Z23 Encounter for immunization: Secondary | ICD-10-CM | POA: Diagnosis not present

## 2020-08-17 DIAGNOSIS — M25562 Pain in left knee: Secondary | ICD-10-CM | POA: Diagnosis not present

## 2020-08-17 DIAGNOSIS — Z634 Disappearance and death of family member: Secondary | ICD-10-CM | POA: Diagnosis not present

## 2020-08-17 DIAGNOSIS — M25561 Pain in right knee: Secondary | ICD-10-CM | POA: Diagnosis not present

## 2020-08-17 DIAGNOSIS — K219 Gastro-esophageal reflux disease without esophagitis: Secondary | ICD-10-CM | POA: Diagnosis not present

## 2020-08-17 DIAGNOSIS — M5010 Cervical disc disorder with radiculopathy, unspecified cervical region: Secondary | ICD-10-CM | POA: Diagnosis not present

## 2020-08-17 DIAGNOSIS — E7849 Other hyperlipidemia: Secondary | ICD-10-CM | POA: Diagnosis not present

## 2020-08-17 DIAGNOSIS — I10 Essential (primary) hypertension: Secondary | ICD-10-CM | POA: Diagnosis not present

## 2020-08-21 ENCOUNTER — Other Ambulatory Visit: Payer: Self-pay

## 2020-08-21 ENCOUNTER — Ambulatory Visit: Payer: Medicare HMO | Admitting: Orthopaedic Surgery

## 2020-08-21 ENCOUNTER — Encounter: Payer: Self-pay | Admitting: Orthopaedic Surgery

## 2020-08-21 DIAGNOSIS — G8929 Other chronic pain: Secondary | ICD-10-CM

## 2020-08-21 DIAGNOSIS — M25562 Pain in left knee: Secondary | ICD-10-CM | POA: Diagnosis not present

## 2020-08-21 DIAGNOSIS — M25561 Pain in right knee: Secondary | ICD-10-CM | POA: Diagnosis not present

## 2020-08-21 NOTE — Progress Notes (Signed)
PROCEDURE NOTE:  The patient requests injections of the right knee , verbal consent was obtained.  The right knee was prepped appropriately after time out was performed.   Sterile technique was observed and injection of 1 cc of Depo-Medrol 40 mg with several cc's of plain xylocaine. Anesthesia was provided by ethyl chloride and a 20-gauge needle was used to inject the knee area. The injection was tolerated well.  A band aid dressing was applied.  The patient was advised to apply ice later today and tomorrow to the injection sight as needed.  PROCEDURE NOTE:  The patient requests injections of the left knee , verbal consent was obtained.  The left knee was prepped appropriately after time out was performed.   Sterile technique was observed and injection of 1 cc of Depo-Medrol 40 mg with several cc's of plain xylocaine. Anesthesia was provided by ethyl chloride and a 20-gauge needle was used to inject the knee area. The injection was tolerated well.  A band aid dressing was applied.  The patient was advised to apply ice later today and tomorrow to the injection sight as needed.  Return in one month.  Electronically Signed Darreld Mclean, MD 1/4/202210:12 AM

## 2020-08-27 DIAGNOSIS — M542 Cervicalgia: Secondary | ICD-10-CM | POA: Diagnosis not present

## 2020-08-31 DIAGNOSIS — M542 Cervicalgia: Secondary | ICD-10-CM | POA: Diagnosis not present

## 2020-08-31 DIAGNOSIS — M4312 Spondylolisthesis, cervical region: Secondary | ICD-10-CM | POA: Diagnosis not present

## 2020-08-31 DIAGNOSIS — M4722 Other spondylosis with radiculopathy, cervical region: Secondary | ICD-10-CM | POA: Diagnosis not present

## 2020-09-10 DIAGNOSIS — M542 Cervicalgia: Secondary | ICD-10-CM | POA: Diagnosis not present

## 2020-09-17 DIAGNOSIS — M542 Cervicalgia: Secondary | ICD-10-CM | POA: Diagnosis not present

## 2020-09-18 ENCOUNTER — Encounter: Payer: Self-pay | Admitting: Orthopaedic Surgery

## 2020-09-18 ENCOUNTER — Other Ambulatory Visit: Payer: Self-pay

## 2020-09-18 ENCOUNTER — Ambulatory Visit: Payer: Medicare HMO | Admitting: Orthopaedic Surgery

## 2020-09-18 VITALS — Ht <= 58 in | Wt 131.0 lb

## 2020-09-18 DIAGNOSIS — M25562 Pain in left knee: Secondary | ICD-10-CM

## 2020-09-18 DIAGNOSIS — M25561 Pain in right knee: Secondary | ICD-10-CM

## 2020-09-18 DIAGNOSIS — G8929 Other chronic pain: Secondary | ICD-10-CM

## 2020-09-18 NOTE — Progress Notes (Signed)
PROCEDURE NOTE:  The patient requests injections of the left knee , verbal consent was obtained.  The left knee was prepped appropriately after time out was performed.   Sterile technique was observed and injection of 1 cc of Depo-Medrol 40 mg with several cc's of plain xylocaine. Anesthesia was provided by ethyl chloride and a 20-gauge needle was used to inject the knee area. The injection was tolerated well.  A band aid dressing was applied.  The patient was advised to apply ice later today and tomorrow to the injection sight as needed.  PROCEDURE NOTE:  The patient requests injections of the right knee , verbal consent was obtained.  The right knee was prepped appropriately after time out was performed.   Sterile technique was observed and injection of 1 cc of Depo-Medrol 40 mg with several cc's of plain xylocaine. Anesthesia was provided by ethyl chloride and a 20-gauge needle was used to inject the knee area. The injection was tolerated well.  A band aid dressing was applied.  The patient was advised to apply ice later today and tomorrow to the injection sight as needed.  Return in one month.  Electronically Signed Sanjuana Kava, MD 2/1/202210:03 AM

## 2020-10-01 DIAGNOSIS — M542 Cervicalgia: Secondary | ICD-10-CM | POA: Diagnosis not present

## 2020-10-08 DIAGNOSIS — M542 Cervicalgia: Secondary | ICD-10-CM | POA: Diagnosis not present

## 2020-10-10 DIAGNOSIS — Z0000 Encounter for general adult medical examination without abnormal findings: Secondary | ICD-10-CM | POA: Diagnosis not present

## 2020-10-10 DIAGNOSIS — E785 Hyperlipidemia, unspecified: Secondary | ICD-10-CM | POA: Diagnosis not present

## 2020-10-10 DIAGNOSIS — N952 Postmenopausal atrophic vaginitis: Secondary | ICD-10-CM | POA: Diagnosis not present

## 2020-10-10 DIAGNOSIS — H02403 Unspecified ptosis of bilateral eyelids: Secondary | ICD-10-CM | POA: Diagnosis not present

## 2020-10-10 DIAGNOSIS — Z0001 Encounter for general adult medical examination with abnormal findings: Secondary | ICD-10-CM | POA: Diagnosis not present

## 2020-10-10 DIAGNOSIS — I519 Heart disease, unspecified: Secondary | ICD-10-CM | POA: Diagnosis not present

## 2020-10-10 DIAGNOSIS — Z9181 History of falling: Secondary | ICD-10-CM | POA: Diagnosis not present

## 2020-10-10 DIAGNOSIS — F5101 Primary insomnia: Secondary | ICD-10-CM | POA: Diagnosis not present

## 2020-10-10 DIAGNOSIS — K219 Gastro-esophageal reflux disease without esophagitis: Secondary | ICD-10-CM | POA: Diagnosis not present

## 2020-10-15 DIAGNOSIS — I6529 Occlusion and stenosis of unspecified carotid artery: Secondary | ICD-10-CM | POA: Diagnosis not present

## 2020-10-15 DIAGNOSIS — E782 Mixed hyperlipidemia: Secondary | ICD-10-CM | POA: Diagnosis not present

## 2020-10-15 DIAGNOSIS — F339 Major depressive disorder, recurrent, unspecified: Secondary | ICD-10-CM | POA: Diagnosis not present

## 2020-10-15 DIAGNOSIS — M5430 Sciatica, unspecified side: Secondary | ICD-10-CM | POA: Diagnosis not present

## 2020-10-15 DIAGNOSIS — K219 Gastro-esophageal reflux disease without esophagitis: Secondary | ICD-10-CM | POA: Diagnosis not present

## 2020-10-15 DIAGNOSIS — F5101 Primary insomnia: Secondary | ICD-10-CM | POA: Diagnosis not present

## 2020-10-15 DIAGNOSIS — M1990 Unspecified osteoarthritis, unspecified site: Secondary | ICD-10-CM | POA: Diagnosis not present

## 2020-10-15 DIAGNOSIS — M79606 Pain in leg, unspecified: Secondary | ICD-10-CM | POA: Diagnosis not present

## 2020-10-15 DIAGNOSIS — M791 Myalgia, unspecified site: Secondary | ICD-10-CM | POA: Diagnosis not present

## 2020-10-15 DIAGNOSIS — E039 Hypothyroidism, unspecified: Secondary | ICD-10-CM | POA: Diagnosis not present

## 2020-10-15 DIAGNOSIS — F39 Unspecified mood [affective] disorder: Secondary | ICD-10-CM | POA: Diagnosis not present

## 2020-10-15 DIAGNOSIS — I2510 Atherosclerotic heart disease of native coronary artery without angina pectoris: Secondary | ICD-10-CM | POA: Diagnosis not present

## 2020-10-15 DIAGNOSIS — E785 Hyperlipidemia, unspecified: Secondary | ICD-10-CM | POA: Diagnosis not present

## 2020-10-15 DIAGNOSIS — I10 Essential (primary) hypertension: Secondary | ICD-10-CM | POA: Diagnosis not present

## 2020-10-15 DIAGNOSIS — M25569 Pain in unspecified knee: Secondary | ICD-10-CM | POA: Diagnosis not present

## 2020-10-16 ENCOUNTER — Ambulatory Visit: Payer: Medicare HMO | Admitting: Orthopaedic Surgery

## 2020-10-16 ENCOUNTER — Other Ambulatory Visit: Payer: Self-pay

## 2020-10-16 ENCOUNTER — Telehealth: Payer: Self-pay | Admitting: Orthopaedic Surgery

## 2020-10-16 ENCOUNTER — Encounter: Payer: Self-pay | Admitting: Orthopaedic Surgery

## 2020-10-16 VITALS — Ht <= 58 in

## 2020-10-16 DIAGNOSIS — G8929 Other chronic pain: Secondary | ICD-10-CM

## 2020-10-16 DIAGNOSIS — M25561 Pain in right knee: Secondary | ICD-10-CM

## 2020-10-16 NOTE — Telephone Encounter (Signed)
Patient was in the office and had a cortisone injection in her right knee and she would like to see what is covered under [employer]. Please advise.

## 2020-10-16 NOTE — Progress Notes (Signed)
PROCEDURE NOTE:  The patient requests injections of the right knee , verbal consent was obtained.  The right knee was prepped appropriately after time out was performed.   Sterile technique was observed and injection of 1 cc of Celestone 6 mg with several cc's of plain xylocaine. Anesthesia was provided by ethyl chloride and a 20-gauge needle was used to inject the knee area. The injection was tolerated well.  A band aid dressing was applied.  The patient was advised to apply ice later today and tomorrow to the injection sight as needed.  I have talked about viscosupplementation of the right knee.  She would like to do this.  Return in one month and begin viscosupplementation.  Call if any problem.  Precautions discussed.   Electronically Signed Sanjuana Kava, MD 3/1/20221:42 PM

## 2020-10-17 NOTE — Telephone Encounter (Signed)
VOB pending online [employer], Right knee Orthovisc.

## 2020-10-17 NOTE — Telephone Encounter (Signed)
She can have Orthovisc, no prior Josem Kaufmann is needed.  Running benefits so we can see how much approximate she will owe OOP.

## 2020-10-19 ENCOUNTER — Other Ambulatory Visit (HOSPITAL_COMMUNITY): Payer: Self-pay | Admitting: Internal Medicine

## 2020-10-19 DIAGNOSIS — Z13820 Encounter for screening for osteoporosis: Secondary | ICD-10-CM

## 2020-10-19 DIAGNOSIS — Z1231 Encounter for screening mammogram for malignant neoplasm of breast: Secondary | ICD-10-CM

## 2020-10-19 NOTE — Telephone Encounter (Signed)
Pending: In RN review Tracking [id number]  Note: [name] for Viscos[employer]lements requires as a condition of meeting medical necessity criteria that "the member has had an inadequate response to conservative non-pharmacologic treatments such as education, strengthening and range of motion exercises, assisted devices, and weight loss." Requests without adequate response to conservative therapy do not meet [employer]'s Pharmacy Coverage Policy for Medicare and Commercial members, and  be denied upon final review unless withdrawn.  Submitted for [employer].  Not sure this will approve as requested info not in office notes.    VOB reads ok to buy and bill.  Patient will have a $20 copay and 20% coinsurance for product.  PA required through [facility] [employer].  Pending.

## 2020-10-22 DIAGNOSIS — M542 Cervicalgia: Secondary | ICD-10-CM | POA: Diagnosis not present

## 2020-10-23 ENCOUNTER — Telehealth: Payer: Self-pay | Admitting: Orthopaedic Surgery

## 2020-10-23 NOTE — Telephone Encounter (Signed)
Spoke with patient and she was driving, stated she would give me a call when she gets home to discuss an appointment.

## 2020-10-23 NOTE — Telephone Encounter (Signed)
Dr Luna Glasgow,  For this patient to have Gel Injections, we need to send documentation of non drug conservative treatment- PT, HEP, assistive devices, weight loss.  Can you either dictate a note for me stating such if applicable?  Or have patient f/u and note this?  I also need to send documentation of xray, which I can do- last one was March 2020.  Please advise?   Inez Catalina,  If patient is scheduled for an appt for injection already, we  need to watch and see if we can get auth by then.  Make sure she does not come in unnecessarily.    Thank you both.

## 2020-10-23 NOTE — Telephone Encounter (Signed)
The below is perfect.  I think there were just knee injection notes.  I did not see the below in the notes.  Please let me know if I missed seeing this in the note. I  be able to get the auth if I create a note stating the below, if you want?  Vs having her wait and all.  Just let me know.

## 2020-10-23 NOTE — Telephone Encounter (Signed)
Cindy Hopkins,  I defer to you to arrange appointment for patient per Dr Brooke Bonito message below?  Thanks.

## 2020-10-23 NOTE — Telephone Encounter (Signed)
Please set up appointment to return here soon for new cortisone shot and X-rays.

## 2020-10-23 NOTE — Telephone Encounter (Signed)
Set up her return and I will get X-rays then.  I will give cortisone shot then and then have her come back in a month to start the weekly doses.  Normally when I give the viscosupplementation, I use the following note: This patient is diagnosed with osteoarthritis of the knee(s).    Radiographs show evidence of joint space narrowing, osteophytes, subchondral sclerosis and/or subchondral cysts.  This patient has knee pain which interferes with functional and activities of daily living.    This patient has experienced inadequate response, adverse effects and/or intolerance with conservative treatments such as acetaminophen, NSAIDS, topical creams, physical therapy or regular exercise, knee bracing and/or weight loss.   This patient has experienced inadequate response or has a contraindication to intra articular steroid injections for at least 3 months.   This patient is not scheduled to have a total knee replacement within 6 months of starting treatment with viscosupplementation.  But is they want something else, I can do.  We have not had insurance refuse the standard note above.

## 2020-10-23 NOTE — Telephone Encounter (Signed)
Patient is returning [place] call.    Cindy Hopkins please call her back at [phone]

## 2020-11-01 ENCOUNTER — Other Ambulatory Visit: Payer: Self-pay

## 2020-11-01 ENCOUNTER — Ambulatory Visit: Payer: Medicare HMO | Admitting: Orthopaedic Surgery

## 2020-11-01 ENCOUNTER — Encounter: Payer: Self-pay | Admitting: Orthopaedic Surgery

## 2020-11-01 ENCOUNTER — Ambulatory Visit (INDEPENDENT_AMBULATORY_CARE_PROVIDER_SITE_OTHER): Payer: Medicare HMO

## 2020-11-01 ENCOUNTER — Telehealth: Payer: Self-pay | Admitting: Radiology

## 2020-11-01 VITALS — BP 114/81 | HR 70 | Ht <= 58 in | Wt 133.0 lb

## 2020-11-01 DIAGNOSIS — M1711 Unilateral primary osteoarthritis, right knee: Secondary | ICD-10-CM | POA: Diagnosis not present

## 2020-11-01 DIAGNOSIS — G8929 Other chronic pain: Secondary | ICD-10-CM

## 2020-11-01 DIAGNOSIS — M25561 Pain in right knee: Secondary | ICD-10-CM

## 2020-11-01 NOTE — Progress Notes (Signed)
She needed X-rays today for viscosupplementation.  X-rays were done and reported separately.  Encounter Diagnosis  Name Primary?  . Chronic pain of right knee Yes   See on 11-13-2020 to begin viscosupplementation.  Electronically Signed Sanjuana Kava, MD 3/17/202210:44 AM

## 2020-11-01 NOTE — Telephone Encounter (Signed)
right knee HA injection Received: Today , , RT  , , RT Appt scheduled with Dr Raliegh Ip, needs HA right knee

## 2020-11-05 DIAGNOSIS — M542 Cervicalgia: Secondary | ICD-10-CM | POA: Diagnosis not present

## 2020-11-06 NOTE — Telephone Encounter (Signed)
Ok to buy and Educational psychologist, patient will have 20% OOP costs for meds and admin.  (estimated $350)   No PA needed.

## 2020-11-07 ENCOUNTER — Other Ambulatory Visit: Payer: Self-pay

## 2020-11-07 ENCOUNTER — Ambulatory Visit (HOSPITAL_COMMUNITY)
Admission: RE | Admit: 2020-11-07 | Discharge: 2020-11-07 | Disposition: A | Discharge status: Home / Self Care | Payer: Medicare HMO | Source: Ambulatory Visit | Attending: Internal Medicine | Admitting: Internal Medicine

## 2020-11-07 DIAGNOSIS — Z1231 Encounter for screening mammogram for malignant neoplasm of breast: Secondary | ICD-10-CM | POA: Diagnosis not present

## 2020-11-07 DIAGNOSIS — Z13820 Encounter for screening for osteoporosis: Secondary | ICD-10-CM

## 2020-11-07 DIAGNOSIS — M8589 Other specified disorders of bone density and structure, multiple sites: Secondary | ICD-10-CM | POA: Diagnosis not present

## 2020-11-07 DIAGNOSIS — Z780 Asymptomatic menopausal state: Secondary | ICD-10-CM | POA: Diagnosis not present

## 2020-11-07 DIAGNOSIS — M85852 Other specified disorders of bone density and structure, left thigh: Secondary | ICD-10-CM | POA: Diagnosis not present

## 2020-11-13 ENCOUNTER — Other Ambulatory Visit: Payer: Self-pay

## 2020-11-13 ENCOUNTER — Ambulatory Visit: Payer: Medicare HMO | Admitting: Orthopaedic Surgery

## 2020-11-13 ENCOUNTER — Encounter: Payer: Self-pay | Admitting: Orthopaedic Surgery

## 2020-11-13 VITALS — BP 151/77 | HR 71 | Ht <= 58 in

## 2020-11-13 DIAGNOSIS — G8929 Other chronic pain: Secondary | ICD-10-CM

## 2020-11-13 DIAGNOSIS — M1711 Unilateral primary osteoarthritis, right knee: Secondary | ICD-10-CM | POA: Diagnosis not present

## 2020-11-13 DIAGNOSIS — M25561 Pain in right knee: Secondary | ICD-10-CM

## 2020-11-13 NOTE — Progress Notes (Signed)
This patient is diagnosed with osteoarthritis of the knee(s).    Radiographs show evidence of joint space narrowing, osteophytes, subchondral sclerosis and/or subchondral cysts.  This patient has knee pain which interferes with functional and activities of daily living.    This patient has experienced inadequate response, adverse effects and/or intolerance with conservative treatments such as acetaminophen, NSAIDS, topical creams, physical therapy or regular exercise, knee bracing and/or weight loss.   This patient has experienced inadequate response or has a contraindication to intra articular steroid injections for at least 3 months.   This patient is not scheduled to have a total knee replacement within 6 months of starting treatment with viscosupplementation.  PROCEDURE NOTE:  Orthovisc injection #  1 of 3   Injection 1 vial of Orthovisc into right  knee  BP (!) 151/77   Pulse 71   Ht 4\' 10"  (1.473 m)   BMI 27.80 kg/m   The right knee exam: there was no synovitis or infection   The knee was prepped sterilely  Ethyl chloride was used to anesthetize the skin A 20 g needle was used to inject the knee with 1 vial of Orthovisc A sterile dressing was placed  There were no complications  Encounter Diagnosis  Name Primary?  . Chronic pain of right knee Yes    Follow up one week  Electronically Signed Sanjuana Kava, MD 3/29/20221:57 PM

## 2020-11-20 ENCOUNTER — Ambulatory Visit: Payer: Medicare HMO | Admitting: Orthopaedic Surgery

## 2020-11-20 ENCOUNTER — Encounter: Payer: Self-pay | Admitting: Orthopaedic Surgery

## 2020-11-20 ENCOUNTER — Other Ambulatory Visit: Payer: Self-pay

## 2020-11-20 DIAGNOSIS — M1711 Unilateral primary osteoarthritis, right knee: Secondary | ICD-10-CM

## 2020-11-20 DIAGNOSIS — G8929 Other chronic pain: Secondary | ICD-10-CM

## 2020-11-20 NOTE — Progress Notes (Signed)
This patient is diagnosed with osteoarthritis of the knee(s).    Radiographs show evidence of joint space narrowing, osteophytes, subchondral sclerosis and/or subchondral cysts.  This patient has knee pain which interferes with functional and activities of daily living.    This patient has experienced inadequate response, adverse effects and/or intolerance with conservative treatments such as acetaminophen, NSAIDS, topical creams, physical therapy or regular exercise, knee bracing and/or weight loss.   This patient has experienced inadequate response or has a contraindication to intra articular steroid injections for at least 3 months.   This patient is not scheduled to have a total knee replacement within 6 months of starting treatment with viscosupplementation.  PROCEDURE NOTE:  Orthovisc injection #  2 of 3   Injection 1 vial of Orthovisc into right  knee  There were no vitals taken for this visit.  The right knee exam: there was no synovitis or infection   The knee was prepped sterilely  Ethyl chloride was used to anesthetize the skin A 20 g needle was used to inject the knee with 1 vial of Orthovisc A sterile dressing was placed  There were no complications  Encounter Diagnosis  Name Primary?  . Chronic pain of right knee Yes    Follow up one week  Call if any problem.  Precautions discussed.   Electronically Signed Sanjuana Kava, MD 4/5/20222:03 PM

## 2020-11-27 ENCOUNTER — Ambulatory Visit: Payer: Medicare HMO | Admitting: Orthopaedic Surgery

## 2020-11-27 ENCOUNTER — Encounter: Payer: Self-pay | Admitting: Orthopaedic Surgery

## 2020-11-27 ENCOUNTER — Other Ambulatory Visit: Payer: Self-pay

## 2020-11-27 DIAGNOSIS — M1711 Unilateral primary osteoarthritis, right knee: Secondary | ICD-10-CM | POA: Diagnosis not present

## 2020-11-27 DIAGNOSIS — G8929 Other chronic pain: Secondary | ICD-10-CM

## 2020-11-27 DIAGNOSIS — M25561 Pain in right knee: Secondary | ICD-10-CM

## 2020-11-27 NOTE — Progress Notes (Signed)
This patient is diagnosed with osteoarthritis of the knee(s).    Radiographs show evidence of joint space narrowing, osteophytes, subchondral sclerosis and/or subchondral cysts.  This patient has knee pain which interferes with functional and activities of daily living.    This patient has experienced inadequate response, adverse effects and/or intolerance with conservative treatments such as acetaminophen, NSAIDS, topical creams, physical therapy or regular exercise, knee bracing and/or weight loss.   This patient has experienced inadequate response or has a contraindication to intra articular steroid injections for at least 3 months.   This patient is not scheduled to have a total knee replacement within 6 months of starting treatment with viscosupplementation.  PROCEDURE NOTE:  Orthovisc injection #  3 of 3   Injection 1 vial of Orthovisc into right  knee  There were no vitals taken for this visit.  The right knee exam: there was no synovitis or infection   The knee was prepped sterilely  Ethyl chloride was used to anesthetize the skin A 20 g needle was used to inject the knee with 1 vial of Orthovisc A sterile dressing was placed  There were no complications   Follow up one month  Encounter Diagnosis  Name Primary?  . Chronic pain of right knee Yes   Call if any problem.  Precautions discussed.   Electronically Signed Sanjuana Kava, MD 4/12/20221:46 PM

## 2020-12-25 ENCOUNTER — Encounter: Payer: Self-pay | Admitting: Orthopaedic Surgery

## 2020-12-25 ENCOUNTER — Other Ambulatory Visit: Payer: Self-pay

## 2020-12-25 ENCOUNTER — Ambulatory Visit: Payer: Medicare HMO | Admitting: Orthopaedic Surgery

## 2020-12-25 DIAGNOSIS — M25562 Pain in left knee: Secondary | ICD-10-CM

## 2020-12-25 DIAGNOSIS — G8929 Other chronic pain: Secondary | ICD-10-CM

## 2020-12-25 DIAGNOSIS — M25561 Pain in right knee: Secondary | ICD-10-CM | POA: Diagnosis not present

## 2020-12-25 NOTE — Progress Notes (Signed)
The Orthovisc did not help much.  She would like to discuss total knee procedure.  I will have her see Dr. Aline Brochure or Dr. Amedeo Kinsman about this.  PROCEDURE NOTE:  The patient requests injections of the right knee , verbal consent was obtained.  The right knee was prepped appropriately after time out was performed.   Sterile technique was observed and injection of 1 cc of Celestone 6 mg with several cc's of plain xylocaine. Anesthesia was provided by ethyl chloride and a 20-gauge needle was used to inject the knee area. The injection was tolerated well.  A band aid dressing was applied.  The patient was advised to apply ice later today and tomorrow to the injection sight as needed.  PROCEDURE NOTE:  The patient requests injections of the left knee , verbal consent was obtained.  The left knee was prepped appropriately after time out was performed.   Sterile technique was observed and injection of 1 cc of Celestone 6 mg with several cc's of plain xylocaine. Anesthesia was provided by ethyl chloride and a 20-gauge needle was used to inject the knee area. The injection was tolerated well.  A band aid dressing was applied.  The patient was advised to apply ice later today and tomorrow to the injection sight as needed.  Call if any problem.  Precautions discussed.   Electronically Signed Sanjuana Kava, MD 5/10/20222:11 PM

## 2020-12-26 ENCOUNTER — Ambulatory Visit: Payer: Medicare HMO

## 2020-12-26 ENCOUNTER — Ambulatory Visit: Payer: Medicare HMO | Admitting: Orthopedic Surgery

## 2020-12-26 ENCOUNTER — Encounter: Payer: Self-pay | Admitting: Orthopedic Surgery

## 2020-12-26 VITALS — BP 162/102 | HR 92 | Ht <= 58 in | Wt 133.0 lb

## 2020-12-26 DIAGNOSIS — M1710 Unilateral primary osteoarthritis, unspecified knee: Secondary | ICD-10-CM

## 2020-12-26 DIAGNOSIS — M1711 Unilateral primary osteoarthritis, right knee: Secondary | ICD-10-CM | POA: Diagnosis not present

## 2020-12-26 DIAGNOSIS — M5410 Radiculopathy, site unspecified: Secondary | ICD-10-CM

## 2020-12-26 DIAGNOSIS — G8929 Other chronic pain: Secondary | ICD-10-CM

## 2020-12-26 DIAGNOSIS — M25561 Pain in right knee: Secondary | ICD-10-CM

## 2020-12-26 DIAGNOSIS — M79604 Pain in right leg: Secondary | ICD-10-CM

## 2020-12-26 NOTE — Addendum Note (Signed)
Addended byCandice Camp on: 12/26/2020 11:08 AM   Modules accepted: Orders, SmartSet

## 2020-12-26 NOTE — Progress Notes (Signed)
Cindy Zupko V.  12/26/2020  Body mass index is 27.8 kg/m.  ASSESSMENT AND PLAN:      Encounter Diagnoses  Name Primary?  . Chronic pain of right knee Yes  . Pain in right leg   . Primary localized osteoarthritis of knee-RIGHT      80 year old female with complex array of symptoms are related to her left knee and left leg.  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  We discussed the possibility of continued pain in this right leg secondary to her lumbar disc disease  She is going to go to physical therapy before surgery  She is going to get help for after surgery.  She has [employer] and will need confirmed help at home as they usually only allow overnight stay  Right total knee replacement as she had cortisone injection and will have to wait 30 days before surgery   Encounter Diagnoses  Name Primary?  . Chronic pain of right knee Yes  . Pain in right leg     Imaging The images we have include 3 views of the right knee she has moderate amount of narrowing of the medial compartment mild of the lateral compartment with very little small osteophytes however when we got a sunrise view of patella today she has severe patellofemoral disease medially with surrounding osteophytes  We also got her lumbar spine she has degenerative scoliosis with facet arthritis between L4 and S1 and disc base narrowing at L4-5 and L5-S1  She also had a right hip x-ray which was normal this was done prior in our office   HISTORY SECTION :  Chief Complaint  Patient presents with  . Knee Pain    Surgical consult  right knee   80 year old female followed by Dr. Luna Glasgow sent for possibility of right total knee arthroplasty.  She has been followed by him for over a year and has had multiple nonmedical interventions  including but not limited to several anti-inflammatories, several cortisone injections as well as 3 Orthovisc injections but continues with severe right knee pain difficulty climbing the stairs difficulty getting out of a chair giving way of the right leg and global knee pain which seems to localize around the patella  She lives in a one-story ranch has a ramp to get into the home she does live alone her son and daughter in [place] and [place]  Her insurance is [employer]  She thinks she can get some of her friends to stay with her the first few days after surgery  She has hypertension and hypothyroidism  Primary care physician is Dr. Wende Neighbors  Her last cortisone injection was yesterday on May 11  Her symptoms were somewhat unclear and on further questioning she did reveal that she has had lower back symptoms since a teenager and was told she would need something done at some point  She is followed by Dr. Arnoldo Morale for cervical disc disease but he has not intervened with her lower back  She has some pain in the top of the foot lateral side of the right leg and also in the lower back bilaterally that pain since start centrally and radiates towards both buttocks and down to the knees bilaterally and into the right leg and top of the foot on the right side    Review of Systems  Musculoskeletal: Positive for  back pain.  All other systems reviewed and are negative.    has a past medical history of Abnormal Pap smear, Anxiety, Cancer ([place]), Depression, Hypercholesteremia (11/12/2012), Hypertension, Hyperthyroidism (11/12/2012), Rectocele (11/12/2012), Thyroid disease, Urge incontinence (11/12/2012), and Vaginal Pap smear, abnormal.   Past Surgical History:  Procedure Laterality Date  . ABDOMINAL HYSTERECTOMY    . bladder and bowel     mesh put in  . BLADDER SURGERY     sling x2  . EYE SURGERY      Social History   Tobacco Use  . Smoking status: Former Smoker    Types: Cigarettes     Quit date: 08/18/1974    Years since quitting: 46.3  . Smokeless tobacco: Never Used  Vaping Use  . Vaping Use: Never used  Substance Use Topics  . Alcohol use: Yes    Comment: wine rarely  . Drug use: No    Family History  Problem Relation Age of Onset  . Hypertension Mother   . Coronary artery disease Mother   . Diabetes Brother   . Cancer Paternal Grandmother        breast  . Diabetes Maternal Grandfather   . COPD Father   . Coronary artery disease Brother   . Diabetes Maternal Uncle       No Known Allergies   Current Outpatient Medications:  .  Acetaminophen (TYLENOL 8 HOUR PO), Take 500 mg by mouth., Disp: , Rfl:  .  aspirin 81 MG tablet, Take 81 mg by mouth daily., Disp: , Rfl:  .  atenolol (TENORMIN) 50 MG tablet, Take 50 mg by mouth daily. , Disp: , Rfl:  .  atorvastatin (LIPITOR) 20 MG tablet, , Disp: , Rfl:  .  escitalopram (LEXAPRO) 20 MG tablet, every morning before breakfast., Disp: , Rfl:  .  fluticasone (FLONASE) 50 MCG/ACT nasal spray, SPRAY 2 SPRAYS INTO EACH NOSTRIL TWICE DAILY FOR 7 DAYS; THEN USE ONCE DAILY AS NEEDED., Disp: 16 g, Rfl: PRN .  lisinopril (ZESTRIL) 20 MG tablet, 20 mg daily., Disp: , Rfl:  .  meloxicam (MOBIC) 15 MG tablet, Take 1 tablet (15 mg total) by mouth daily., Disp: 90 tablet, Rfl: 5 .  methimazole (TAPAZOLE) 5 MG tablet, Take 5 mg by mouth daily. One daily on Monday Wednesday and Fridays and two on Saturdays, Disp: , Rfl:  .  pantoprazole (PROTONIX) 40 MG tablet, Take 40 mg by mouth. , Disp: , Rfl:  .  zolpidem (AMBIEN) 10 MG tablet, Take 10 mg by mouth at bedtime as needed for sleep., Disp: , Rfl:    PHYSICAL EXAM SECTION: BP (!) 162/102   Pulse 92   Ht 4\' 10"  (1.473 m)   Wt 133 lb (60.3 kg)   BMI 27.80 kg/m   Body mass index is 27.8 kg/m.   General appearance: Well-developed well-nourished no gross deformities  Eyes clear normal vision with glasses no evidence of conjunctivitis or jaundice, extraocular muscles  intact  ENT: ears hearing normal, nasal passages clear, throat clear   Lymph nodes: No lymphadenopathy  Neck is supple without palpable mass, full range of motion  Cardiovascular normal pulse and perfusion in all 4 extremities normal color without edema  Neurologically deep tendon reflexes are equal and normal, no sensation loss or deficits no pathologic reflexes  Psychological: Awake alert and oriented x3 mood and affect normal  Skin no lacerations or ulcerations no nodularity no palpable masses, no erythema or nodularity  Musculoskeletal:   Lumbar spine tender lumbar  spine especially in the small the back with increased lordosis decreased flexion some increased pain with extension  Appears to be neurovascular intact distally however she has tenderness on the lateral compartment of the lower leg posterior thigh and buttock area  Right knee has excellent range of motion she has mild tenderness in the medial and lateral joint lines and more crepitance and patellofemoral pain surrounding the patella     10:44 AM

## 2020-12-26 NOTE — Patient Instructions (Addendum)
START THERAPY FOR YOUR BACK   SURGERY SCHEDULED FOR June 14TH   You have decided to proceed with knee replacement surgery. You have decided not to continue with nonoperative measures such as but not limited to oral medication, weight loss, activity modification, physical therapy, bracing, or injection.  We will perform the procedure commonly known as total knee replacement. Some of the risks associated with knee replacement surgery include but are not limited to Bleeding Infection Swelling Stiffness Blood clot Pulmonary embolism  Loosening of the implant Pain that persists even after surgery  Infection is especially devastating complication of knee surgery although rare. If infection does occur your implant will usually have to be removed and several surgeries and antibiotics will be needed to eradicate the infection prior to performing a repeat replacement.   In some cases amputation is required to eradicate the infection. In other rare cases a knee fusion is needed   In compliance with recent [place] law in federal regulation regarding opioid use and abuse and addiction, we will taper (stop) opioid medication after 2 weeks.  If you're not comfortable with these risks and would like to continue with nonoperative treatment please let Dr. Aline Brochure know prior to your surgery.

## 2021-01-10 DIAGNOSIS — M5416 Radiculopathy, lumbar region: Secondary | ICD-10-CM | POA: Diagnosis not present

## 2021-01-15 DIAGNOSIS — M5416 Radiculopathy, lumbar region: Secondary | ICD-10-CM | POA: Diagnosis not present

## 2021-01-17 DIAGNOSIS — M5416 Radiculopathy, lumbar region: Secondary | ICD-10-CM | POA: Diagnosis not present

## 2021-01-21 DIAGNOSIS — Z1283 Encounter for screening for malignant neoplasm of skin: Secondary | ICD-10-CM | POA: Diagnosis not present

## 2021-01-21 DIAGNOSIS — T148XXA Other injury of unspecified body region, initial encounter: Secondary | ICD-10-CM | POA: Diagnosis not present

## 2021-01-21 DIAGNOSIS — D225 Melanocytic nevi of trunk: Secondary | ICD-10-CM | POA: Diagnosis not present

## 2021-01-21 DIAGNOSIS — D2272 Melanocytic nevi of left lower limb, including hip: Secondary | ICD-10-CM | POA: Diagnosis not present

## 2021-01-21 DIAGNOSIS — D485 Neoplasm of uncertain behavior of skin: Secondary | ICD-10-CM | POA: Diagnosis not present

## 2021-01-21 NOTE — Patient Instructions (Signed)
Ilene Witcher V.  01/21/2021     @PREFPERIOPPHARMACY @   Your procedure is scheduled on  01/29/2021.   Report to [facility] at  [phone]  A.M.   Call this number if you have problems the morning of surgery:  [phone]   Your COVID test is scheduled for 01/28/2021 @ 0800.     Remember:  Do not eat or drink after midnight.                        Take these medicines the morning of surgery with A SIP OF WATER  Atenolol, lexapro, protonix.     Please brush your teeth.  Do not wear jewelry, make-up or nail polish.  Do not wear lotions, powders, or perfumes, or deodorant.  Do not shave 48 hours prior to surgery.  Men may shave face and neck.  Do not bring valuables to the hospital.  [facility] is not responsible for any belongings or valuables.  Contacts, dentures or bridgework may not be worn into surgery.  Leave your suitcase in the car.  After surgery it may be brought to your room.  For patients admitted to the hospital, discharge time will be determined by your treatment team.  Patients discharged the day of surgery will not be allowed to drive home and must have someone with them for 24 hours.    Special instructions:  DO NOT smoke tobacco or vape for 24 hours before your procedure.  Please read over the following fact sheets that you were given. Coughing and Deep Breathing, Blood Transfusion Information, Total Joint Packet, Surgical Site Infection Prevention, Anesthesia Post-op Instructions and Care and Recovery After Surgery       Total Knee Replacement, Care After This sheet gives you information about how to care for yourself after your procedure. Your health care provider may also give you more specific instructions. If you have problems or questions, contact your health care provider. What can I expect after the procedure? After the procedure, it is common to have:  Redness, pain, and swelling at the incision  area.  Stiffness.  Discomfort.  A small amount of blood or clear fluid coming from your incision. Follow these instructions at home: Medicines  Take over-the-counter and prescription medicines only as told by your health care provider.  If you were prescribed a blood thinner (anticoagulant), take it as told by your health care provider.  Ask your health care provider if the medicine prescribed to you: ? Requires you to avoid driving or using machinery. ? Can cause constipation. You may need to take these actions to prevent or treat constipation:  Drink enough fluid to keep your urine pale yellow.  Take over-the-counter or prescription medicines.  Eat foods that are high in fiber, such as beans, whole grains, and fresh fruits and vegetables.  Limit foods that are high in fat and processed sugars, such as fried or sweet foods. Incision care  Follow instructions from your health care provider about how to take care of your incision. Make sure you: ? Wash your hands with soap and water for at least 20 seconds before and after you change your bandage (dressing). If soap and water are not available, use hand sanitizer. ? Change your dressing as told by your health care provider. ? Leave stitches (sutures), staples, skin glue, or adhesive strips in place. These skin closures may need to stay in place for  2 weeks or longer. If adhesive strip edges start to loosen and curl up, you may trim the loose edges. Do not remove adhesive strips completely unless your health care provider tells you to do that.  Do not take baths, swim, or use a hot tub until your health care provider approves.  Check your incision area every day for signs of infection. Check for: ? More redness, swelling, or pain. ? More fluid or blood. ? Warmth. ? Pus or a bad smell.   Activity  Rest as told by your health care provider.  Avoid sitting for a long time without moving. Get up to take short walks every 1-2  hours. This is important to improve blood flow and breathing. Ask for help if you feel weak or unsteady.  Follow instructions from your health care provider about using a walker, crutches, or a cane. ? You may use your legs to support (bear) your body weight as told by your health care provider. Follow instructions about how much weight you may safely support on your affected leg (weight-bearing restrictions). ? A physical therapist may show you how to get out of a bed and chair and how to go up and down stairs. You will first do this with a walker, crutches, or a cane and then without any of these devices. ? Once you are able to walk without a limp, you may stop using a walker, crutches, or a cane.  Do exercises as told by your health care provider or physical therapist.  Avoid high-impact activities, including running, jumping rope, and doing jumping jacks.  Do not play contact sports until your health care provider approves.  Return to your normal activities as told by your health care provider. Ask your health care provider what activities are safe for you. Managing pain, stiffness, and swelling  If directed, put ice on your knee. To do this: ? Put ice in a plastic bag or use the icing device (cold flow pad) that you were given. Follow instructions from your health care provider about how to use the icing device. ? Place a towel between your skin and the bag or between your skin and the icing device. ? Leave the ice on for 20 minutes, 2-3 times a day. ? Remove the ice if your skin turns bright red. This is very important. If you cannot feel pain, heat, or cold, you have a greater risk of damage to the area.  Move your toes often to reduce stiffness and swelling.  Raise (elevate) your leg above the level of your heart while you are sitting or lying down. ? Use several pillows to keep your leg straight. ? Do not put a pillow just under the knee. If the knee is bent for a long time, this  may lead to stiffness.  Wear elastic knee support as told by your health care provider.   Safety  To help prevent falls, keep floors clear of objects you may trip over. Place items that you may need within easy reach.  Wear an apron or tool belt with pockets for carrying objects. This leaves your hands free to help with your balance.  Ask your health care provider when it is safe to drive.   General instructions  Wear compression stockings as told by your health care provider. These stockings help to prevent blood clots and reduce swelling in your legs.  Continue with breathing exercises. This helps prevent lung infection.  Do not use any products that contain  nicotine or tobacco. These products include cigarettes, chewing tobacco, and vaping devices, such as e-cigarettes. These can delay healing after surgery. If you need help quitting, ask your health care provider.  Tell your health care provider if you plan to have dental work. Also: ? Tell your dentist about your joint replacement. ? Ask your health care provider if there are any special instructions you need to follow before having dental care and routine cleanings.  Keep all follow-up visits. This is important. Contact a health care provider if:  You have a fever or chills.  You have a cough or feel short of breath.  Your medicine is not controlling your pain.  You have any of these signs of infection: ? More redness, swelling, or pain around your incision. ? More fluid or blood coming from your incision. ? Warmth coming from your incision. ? Pus or a bad smell coming from your incision.  You fall. Get help right away if:  You have severe pain.  You have trouble breathing.  You have chest pain.  You have redness, swelling, pain, or warmth in your calf or leg.  Your incision breaks open after sutures or staples are removed. These symptoms may represent a serious problem that is an emergency. Do not wait to see  if the symptoms will go away. Get medical help right away. Call your local emergency services (911 in the [place] Do not drive yourself to the hospital. Summary  After the procedure, it is common to have pain and swelling at the incision area, a small amount of blood or fluid coming from your incision, and stiffness.  Follow instructions from your health care provider about how to take care of your incision.  Use crutches, a walker, or a cane as told by your health care provider. This information is not intended to replace advice given to you by your health care provider. Make sure you discuss any questions you have with your health care provider. Document Revised: 01/24/2020 Document Reviewed: 01/24/2020 Elsevier Patient Education  [place].  Spinal Anesthesia and Epidural Anesthesia, Care After This sheet gives you information about how to care for yourself after your procedure. Your doctor may also give you more specific instructions. If you have problems or questions, contact your doctor. What can I expect after the procedure? While the medicines you were given are still having effects, it is common to:  Feel like you may vomit (nauseous).  Vomit.  Have a numb feeling or tingling in your legs.  Have problems when you pee (urinate).  Feel itchy. Follow these instructions at home: For the time period you were told by your doctor:  Rest.  Do not do activities where you could fall or get hurt.  Do not drive or use machines.  Do not drink alcohol.  Do not take sleeping pills or medicines that make you drowsy.  Do not make big decisions or sign legal documents.  Do not take care of children on your own.   Eating and drinking  If you vomit, wait a short time. When you can drink without vomiting, try to drink some water, juice, or clear soup.  Drink enough fluid to keep your pee (urine) pale yellow.  Make sure you do not feel like vomiting before you eat solid  foods.  Follow the diet that your doctor recommends. General instructions  If you have sleep apnea, surgery and certain medicines can raise your risk for breathing problems. Follow instructions from your  doctor about when to wear your sleep device.  Have a responsible adult stay with you for the time you are told. It is important to have someone help care for you until you are awake and alert.  Return to your normal activities as told by your doctor. Ask your doctor what activities are safe for you.  Take over-the-counter and prescription medicines only as told by your doctor.  Do not use any products that contain nicotine or tobacco, such as cigarettes, e-cigarettes, and chewing tobacco. If you need help quitting, ask your doctor.  Keep all follow-up visits as told by your doctor. This is important. Contact a doctor if:  It has been more than one day since your procedure and you feel like you may vomit or you are vomiting.  You have a rash. Get help right away if:  You have a fever.  You have a headache that lasts a long time.  You have a very bad headache.  Your vision is blurry or you see two of a single object (double vision).  You are dizzy or light-headed.  You faint.  Your arms or legs tingle, feel weak, or get numb.  You have trouble breathing.  You cannot pee. These symptoms may be an emergency. Get help right away. Call your local emergency services (911 in the [place]  Do not wait to see if the symptoms will go away.  Do not drive yourself to the hospital. Summary  After the procedure, have a responsible adult stay with you at home.  Do not do activities that might cause you to get hurt.  Do not drive, use machinery, drink alcohol, or make important decisions as told by your doctor.  Do not use products that contain nicotine or tobacco.  Get help right away if you have a very bad headache, trouble breathing, or you cannot pee. This information is not  intended to replace advice given to you by your health care provider. Make sure you discuss any questions you have with your health care provider. Document Revised: 04/19/2020 Document Reviewed: 09/22/2019 Elsevier Patient Education  2021 [place] Anesthesia, Adult, Care After This sheet gives you information about how to care for yourself after your procedure. Your health care provider may also give you more specific instructions. If you have problems or questions, contact your health care provider. What can I expect after the procedure? After the procedure, the following side effects are common:  Pain or discomfort at the IV site.  Nausea.  Vomiting.  Sore throat.  Trouble concentrating.  Feeling cold or chills.  Feeling weak or tired.  Sleepiness and fatigue.  Soreness and body aches. These side effects can affect parts of the body that were not involved in surgery. Follow these instructions at home: For the time period you were told by your health care provider:  Rest.  Do not participate in activities where you could fall or become injured.  Do not drive or use machinery.  Do not drink alcohol.  Do not take sleeping pills or medicines that cause drowsiness.  Do not make important decisions or sign legal documents.  Do not take care of children on your own.   Eating and drinking  Follow any instructions from your health care provider about eating or drinking restrictions.  When you feel hungry, start by eating small amounts of foods that are soft and easy to digest (bland), such as toast. Gradually return to your regular diet.  Drink enough  fluid to keep your urine pale yellow.  If you vomit, rehydrate by drinking water, juice, or clear broth. General instructions  If you have sleep apnea, surgery and certain medicines can increase your risk for breathing problems. Follow instructions from your health care provider about wearing your sleep  device: ? Anytime you are sleeping, including during daytime naps. ? While taking prescription pain medicines, sleeping medicines, or medicines that make you drowsy.  Have a responsible adult stay with you for the time you are told. It is important to have someone help care for you until you are awake and alert.  Return to your normal activities as told by your health care provider. Ask your health care provider what activities are safe for you.  Take over-the-counter and prescription medicines only as told by your health care provider.  If you smoke, do not smoke without supervision.  Keep all follow-up visits as told by your health care provider. This is important. Contact a health care provider if:  You have nausea or vomiting that does not get better with medicine.  You cannot eat or drink without vomiting.  You have pain that does not get better with medicine.  You are unable to pass urine.  You develop a skin rash.  You have a fever.  You have redness around your IV site that gets worse. Get help right away if:  You have difficulty breathing.  You have chest pain.  You have blood in your urine or stool, or you vomit blood. Summary  After the procedure, it is common to have a sore throat or nausea. It is also common to feel tired.  Have a responsible adult stay with you for the time you are told. It is important to have someone help care for you until you are awake and alert.  When you feel hungry, start by eating small amounts of foods that are soft and easy to digest (bland), such as toast. Gradually return to your regular diet.  Drink enough fluid to keep your urine pale yellow.  Return to your normal activities as told by your health care provider. Ask your health care provider what activities are safe for you. This information is not intended to replace advice given to you by your health care provider. Make sure you discuss any questions you have with your  health care provider. Document Revised: 04/19/2020 Document Reviewed: 11/17/2019 Elsevier Patient Education  2021 [place]. How to Use Chlorhexidine for Bathing Chlorhexidine gluconate (CHG) is a germ-killing (antiseptic) solution that is used to clean the skin. It can get rid of the bacteria that normally live on the skin and can keep them away for about 24 hours. To clean your skin with CHG, you may be given:  A CHG solution to use in the shower or as part of a sponge bath.  A prepackaged cloth that contains CHG. Cleaning your skin with CHG may help lower the risk for infection:  While you are staying in the intensive care unit of the hospital.  If you have a vascular access, such as a central line, to provide short-term or long-term access to your veins.  If you have a catheter to drain urine from your bladder.  If you are on a ventilator. A ventilator is a machine that helps you breathe by moving air in and out of your lungs.  After surgery. What are the risks? Risks of using CHG include:  A skin reaction.  Hearing loss, if CHG gets  in your ears.  Eye injury, if CHG gets in your eyes and is not rinsed out.  The CHG product catching fire. Make sure that you avoid smoking and flames after applying CHG to your skin. Do not use CHG:  If you have a chlorhexidine allergy or have previously reacted to chlorhexidine.  On babies younger than 5 months of age. How to use CHG solution  Use CHG only as told by your health care provider, and follow the instructions on the label.  Use the full amount of CHG as directed. Usually, this is one bottle. During a shower Follow these steps when using CHG solution during a shower (unless your health care provider gives you different instructions): 1. Start the shower. 2. Use your normal soap and shampoo to wash your face and hair. 3. Turn off the shower or move out of the shower stream. 4. Pour the CHG onto a clean washcloth. Do not  use any type of brush or rough-edged sponge. 5. Starting at your neck, lather your body down to your toes. Make sure you follow these instructions: ? If you will be having surgery, pay special attention to the part of your body where you will be having surgery. Scrub this area for at least 1 minute. ? Do not use CHG on your head or face. If the solution gets into your ears or eyes, rinse them well with water. ? Avoid your genital area. ? Avoid any areas of skin that have broken skin, cuts, or scrapes. ? Scrub your back and under your arms. Make sure to wash skin folds. 6. Let the lather sit on your skin for 1-2 minutes or as long as told by your health care provider. 7. Thoroughly rinse your entire body in the shower. Make sure that all body creases and crevices are rinsed well. 8. Dry off with a clean towel. Do not put any substances on your body afterward--such as powder, lotion, or perfume--unless you are told to do so by your health care provider. Only use lotions that are recommended by the manufacturer. 9. Put on clean clothes or pajamas. 10. If it is the night before your surgery, sleep in clean sheets.   During a sponge bath Follow these steps when using CHG solution during a sponge bath (unless your health care provider gives you different instructions): 1. Use your normal soap and shampoo to wash your face and hair. 2. Pour the CHG onto a clean washcloth. 3. Starting at your neck, lather your body down to your toes. Make sure you follow these instructions: ? If you will be having surgery, pay special attention to the part of your body where you will be having surgery. Scrub this area for at least 1 minute. ? Do not use CHG on your head or face. If the solution gets into your ears or eyes, rinse them well with water. ? Avoid your genital area. ? Avoid any areas of skin that have broken skin, cuts, or scrapes. ? Scrub your back and under your arms. Make sure to wash skin folds. 4. Let  the lather sit on your skin for 1-2 minutes or as long as told by your health care provider. 5. Using a different clean, wet washcloth, thoroughly rinse your entire body. Make sure that all body creases and crevices are rinsed well. 6. Dry off with a clean towel. Do not put any substances on your body afterward--such as powder, lotion, or perfume--unless you are told to do so by  your health care provider. Only use lotions that are recommended by the manufacturer. 7. Put on clean clothes or pajamas. 8. If it is the night before your surgery, sleep in clean sheets. How to use CHG prepackaged cloths  Only use CHG cloths as told by your health care provider, and follow the instructions on the label.  Use the CHG cloth on clean, dry skin.  Do not use the CHG cloth on your head or face unless your health care provider tells you to.  When washing with the CHG cloth: ? Avoid your genital area. ? Avoid any areas of skin that have broken skin, cuts, or scrapes. Before surgery Follow these steps when using a CHG cloth to clean before surgery (unless your health care provider gives you different instructions): 1. Using the CHG cloth, vigorously scrub the part of your body where you will be having surgery. Scrub using a back-and-forth motion for 3 minutes. The area on your body should be completely wet with CHG when you are done scrubbing. 2. Do not rinse. Discard the cloth and let the area air-dry. Do not put any substances on the area afterward, such as powder, lotion, or perfume. 3. Put on clean clothes or pajamas. 4. If it is the night before your surgery, sleep in clean sheets.   For general bathing Follow these steps when using CHG cloths for general bathing (unless your health care provider gives you different instructions). 1. Use a separate CHG cloth for each area of your body. Make sure you wash between any folds of skin and between your fingers and toes. Wash your body in the following order,  switching to a new cloth after each step: ? The front of your neck, shoulders, and chest. ? Both of your arms, under your arms, and your hands. ? Your stomach and groin area, avoiding the genitals. ? Your right leg and foot. ? Your left leg and foot. ? The back of your neck, your back, and your buttocks. 2. Do not rinse. Discard the cloth and let the area air-dry. Do not put any substances on your body afterward--such as powder, lotion, or perfume--unless you are told to do so by your health care provider. Only use lotions that are recommended by the manufacturer. 3. Put on clean clothes or pajamas. Contact a health care provider if:  Your skin gets irritated after scrubbing.  You have questions about using your solution or cloth. Get help right away if:  Your eyes become very red or swollen.  Your eyes itch badly.  Your skin itches badly and is red or swollen.  Your hearing changes.  You have trouble seeing.  You have swelling or tingling in your mouth or throat.  You have trouble breathing.  You swallow any chlorhexidine. Summary  Chlorhexidine gluconate (CHG) is a germ-killing (antiseptic) solution that is used to clean the skin. Cleaning your skin with CHG may help to lower your risk for infection.  You may be given CHG to use for bathing. It may be in a bottle or in a prepackaged cloth to use on your skin. Carefully follow your health care provider's instructions and the instructions on the product label.  Do not use CHG if you have a chlorhexidine allergy.  Contact your health care provider if your skin gets irritated after scrubbing. This information is not intended to replace advice given to you by your health care provider. Make sure you discuss any questions you have with your health care provider.  Document Revised: 01/20/2020 Document Reviewed: 01/20/2020 Elsevier Patient Education  [place].

## 2021-01-22 ENCOUNTER — Other Ambulatory Visit: Payer: Self-pay | Admitting: Orthopedic Surgery

## 2021-01-22 DIAGNOSIS — M1710 Unilateral primary osteoarthritis, unspecified knee: Secondary | ICD-10-CM

## 2021-01-22 NOTE — Progress Notes (Signed)
amb  

## 2021-01-22 NOTE — Addendum Note (Signed)
Addended byCandice Camp on: 01/22/2021 01:00 PM   Modules accepted: Orders

## 2021-01-23 ENCOUNTER — Encounter (HOSPITAL_COMMUNITY)
Admission: RE | Admit: 2021-01-23 | Discharge: 2021-01-23 | Disposition: A | Discharge status: Home / Self Care | Payer: Medicare HMO | Source: Ambulatory Visit | Attending: Orthopedic Surgery | Admitting: Orthopedic Surgery

## 2021-01-23 ENCOUNTER — Other Ambulatory Visit: Payer: Self-pay

## 2021-01-23 ENCOUNTER — Encounter (HOSPITAL_COMMUNITY): Payer: Self-pay

## 2021-01-23 DIAGNOSIS — Z01818 Encounter for other preprocedural examination: Secondary | ICD-10-CM | POA: Diagnosis not present

## 2021-01-23 LAB — BASIC METABOLIC PANEL
Anion gap: 10 (ref 5–15)
BUN: 13 mg/dL (ref 8–23)
CO2: 25 mmol/L (ref 22–32)
Calcium: 8.6 mg/dL — ABNORMAL LOW (ref 8.9–10.3)
Chloride: 91 mmol/L — ABNORMAL LOW (ref 98–111)
Creatinine, Ser: 0.96 mg/dL (ref 0.44–1.00)
GFR, Estimated: >60 mL/min (ref >60)
Glucose, Bld: 107 mg/dL — ABNORMAL HIGH (ref 70–99)
Potassium: 3.1 mmol/L — ABNORMAL LOW (ref 3.5–5.1)
Sodium: 126 mmol/L — ABNORMAL LOW (ref 135–145)

## 2021-01-23 LAB — CBC WITH DIFFERENTIAL/PLATELET
Abs Immature Granulocytes: 0.03 10*3/uL (ref 0.00–0.07)
Basophils Absolute: 0 10*3/uL (ref 0.0–0.1)
Basophils Relative: 1 %
Eosinophils Absolute: 0 10*3/uL (ref 0.0–0.5)
Eosinophils Relative: 0 %
HCT: 36.5 % (ref 36.0–46.0)
Hemoglobin: 12.1 g/dL (ref 12.0–15.0)
Immature Granulocytes: 1 %
Lymphocytes Relative: 21 %
Lymphs Abs: 0.7 10*3/uL (ref 0.7–4.0)
MCH: 29 pg (ref 26.0–34.0)
MCHC: 33.2 g/dL (ref 30.0–36.0)
MCV: 87.5 fL (ref 80.0–100.0)
Monocytes Absolute: 0.4 10*3/uL (ref 0.1–1.0)
Monocytes Relative: 12 %
Neutro Abs: 2.2 10*3/uL (ref 1.7–7.7)
Neutrophils Relative %: 65 %
Platelets: 95 10*3/uL — ABNORMAL LOW (ref 150–400)
RBC: 4.17 MIL/uL (ref 3.87–5.11)
RDW: 12.5 % (ref 11.5–15.5)
WBC: 3.3 10*3/uL — ABNORMAL LOW (ref 4.0–10.5)
nRBC: 0 % (ref 0.0–0.2)

## 2021-01-23 LAB — TYPE AND SCREEN
ABO/RH(D): AB POS
Antibody Screen: NEGATIVE

## 2021-01-23 NOTE — Addendum Note (Signed)
Addended byCandice Camp on: 01/23/2021 02:45 PM   Modules accepted: Orders

## 2021-01-24 ENCOUNTER — Telehealth: Payer: Self-pay | Admitting: Radiology

## 2021-01-24 ENCOUNTER — Other Ambulatory Visit: Payer: Self-pay | Admitting: Orthopedic Surgery

## 2021-01-24 DIAGNOSIS — E876 Hypokalemia: Secondary | ICD-10-CM

## 2021-01-24 LAB — PREPARE RBC (CROSSMATCH)

## 2021-01-24 MED ORDER — POTASSIUM CHLORIDE CRYS ER 20 MEQ PO TBCR: 20.0000 meq | EXTENDED_RELEASE_TABLET | Freq: Two times a day (BID) | ORAL | 0 refills | Status: DC

## 2021-01-24 NOTE — Telephone Encounter (Signed)
-----   Message from Carole Civil, MD sent at 01/24/2021  8:00 AM EDT ----- Needs potassium

## 2021-01-24 NOTE — Telephone Encounter (Signed)
Thanks I called her to advise, K sent in today she should get it as soon as possible Had to leave message asked her to call back let me know she got the message

## 2021-01-24 NOTE — Telephone Encounter (Signed)
She called back and left a message for me / I have discussed the K with her she is on way to pick up now.

## 2021-01-25 ENCOUNTER — Other Ambulatory Visit (HOSPITAL_COMMUNITY): Payer: Medicare HMO

## 2021-01-28 ENCOUNTER — Other Ambulatory Visit: Payer: Self-pay

## 2021-01-28 ENCOUNTER — Other Ambulatory Visit (HOSPITAL_COMMUNITY)
Admission: RE | Admit: 2021-01-28 | Discharge: 2021-01-28 | Disposition: A | Discharge status: Home / Self Care | Payer: Medicare HMO | Source: Ambulatory Visit | Attending: Orthopedic Surgery | Admitting: Orthopedic Surgery

## 2021-01-28 DIAGNOSIS — Z01812 Encounter for preprocedural laboratory examination: Secondary | ICD-10-CM | POA: Insufficient documentation

## 2021-01-28 DIAGNOSIS — Z20822 Contact with and (suspected) exposure to covid-19: Secondary | ICD-10-CM | POA: Diagnosis not present

## 2021-01-28 LAB — SARS CORONAVIRUS 2 (TAT 6-24 HRS): SARS Coronavirus 2: NEGATIVE

## 2021-01-28 NOTE — H&P (Signed)
Cindy Hopkins V.   12/26/2020   Body mass index is 27.8 kg/m.   ASSESSMENT AND PLAN:            Encounter Diagnoses  Name Primary?   Chronic pain of right knee Yes   Pain in right leg     Primary localized osteoarthritis of knee-RIGHT         80 year old female with complex array of symptoms are related to her left knee and left leg.  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  We discussed the possibility of continued pain in this right leg secondary to her lumbar disc disease   She is going to go to physical therapy before surgery   She is going to get help for after surgery.  She has [employer] and will need confirmed help at home as they usually only allow overnight stay   Right total knee replacement as she had cortisone injection and will have to wait 30 days before surgery         Encounter Diagnoses  Name Primary?   Chronic pain of right knee Yes   Pain in right leg        Imaging The images we have include 3 views of the right knee she has moderate amount of narrowing of the medial compartment mild of the lateral compartment with very little small osteophytes however when we got a sunrise view of patella today she has severe patellofemoral disease medially with surrounding osteophytes   We also got her lumbar spine she has degenerative scoliosis with facet arthritis between L4 and S1 and disc base narrowing at L4-5 and L5-S1   She also had a right hip x-ray which was normal this was done prior in our office     HISTORY SECTION :       Chief Complaint  Patient presents with   Knee Pain      Surgical consult  right knee    80 year old female followed by Dr. Luna Glasgow sent for possibility of right total knee arthroplasty.  She has been followed by him for over a year and has had  multiple nonmedical interventions including but not limited to several anti-inflammatories, several cortisone injections as well as 3 Orthovisc injections but continues with severe right knee pain difficulty climbing the stairs difficulty getting out of a chair giving way of the right leg and global knee pain which seems to localize around the patella   She lives in a one-story ranch has a ramp to get into the home she does live alone her son and daughter in [place] and [place]   Her insurance is [employer]   She thinks she can get some of her friends to stay with her the first few days after surgery   She has hypertension and hypothyroidism   Primary care physician is Dr. 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  All other systems reviewed and are negative.      has a past medical history of Abnormal Pap smear, Anxiety, Cancer ([place]), Depression, Hypercholesteremia (11/12/2012), Hypertension, Hyperthyroidism (11/12/2012), Rectocele (11/12/2012), Thyroid disease, Urge incontinence (11/12/2012), and Vaginal Pap smear, abnormal.         Past Surgical History:  Procedure Laterality Date   ABDOMINAL HYSTERECTOMY       bladder and bowel        mesh put in   BLADDER SURGERY        sling x2   EYE SURGERY          Social History          Tobacco Use   Smoking status: Former Smoker      Types: Cigarettes      Quit date: 08/18/1974      Years since quitting: 46.3   Smokeless tobacco: Never Used  Vaping Use   Vaping Use: Never used  Substance Use Topics   Alcohol use: Yes      Comment: wine rarely   Drug use: No           Family History  Problem Relation Age of Onset   Hypertension Mother     Coronary artery disease Mother     Diabetes Brother     Cancer Paternal Grandmother          breast   Diabetes Maternal Grandfather     COPD Father     Coronary artery disease Brother     Diabetes Maternal Uncle            No Known Allergies     Current Outpatient Medications:   Acetaminophen (TYLENOL 8 HOUR PO), Take 500 mg by mouth., Disp: , Rfl:   aspirin 81 MG tablet, Take 81 mg by mouth daily., Disp: , Rfl:   atenolol (TENORMIN) 50 MG tablet, Take 50 mg by mouth daily. , Disp: , Rfl:   atorvastatin (LIPITOR) 20 MG tablet, , Disp: , Rfl:   escitalopram (LEXAPRO) 20 MG tablet, every morning before breakfast., Disp: , Rfl:   fluticasone (FLONASE) 50 MCG/ACT nasal spray, SPRAY 2 SPRAYS INTO EACH NOSTRIL TWICE DAILY FOR 7 DAYS; THEN USE ONCE DAILY AS NEEDED., Disp: 16 g, Rfl: PRN   lisinopril (ZESTRIL) 20 MG tablet, 20 mg daily., Disp: , Rfl:   meloxicam (MOBIC) 15 MG tablet, Take 1 tablet (15 mg total) by mouth daily., Disp: 90 tablet, Rfl: 5   methimazole (TAPAZOLE) 5 MG tablet, Take 5 mg by mouth daily. One daily on Monday Wednesday and Fridays and two on Saturdays, Disp: , Rfl:   pantoprazole (PROTONIX) 40 MG tablet, Take 40 mg by mouth. , Disp: , Rfl:   zolpidem (AMBIEN) 10 MG tablet, Take 10 mg by mouth at bedtime as needed for sleep., Disp: , Rfl:      PHYSICAL EXAM SECTION: BP (!) 162/102   Pulse 92   Ht 4\' 10"  (1.473 m)   Wt 133 lb (60.3 kg)   BMI 27.80 kg/m   Body mass index is 27.8 kg/m.     General appearance: Well-developed well-nourished no gross deformities   Eyes clear normal vision with  glasses no  evidence of conjunctivitis or jaundice, extraocular muscles intact   ENT: ears hearing normal, nasal passages clear, throat clear  Lymph nodes: No lymphadenopathy   Neck is supple without palpable mass, full range of motion  Cardiovascular normal pulse and perfusion in all 4 extremities normal color without edema  Neurologically deep tendon reflexes are equal and normal, no sensation loss or deficits no pathologic reflexes   Psychological: Awake alert and oriented x3 mood and affect normal   Skin no lacerations or ulcerations no nodularity no palpable masses, no erythema or nodularity   Musculoskeletal:    Lumbar spine tender lumbar spine especially in the small the back with increased lordosis decreased flexion some increased pain with extension   Appears to be neurovascular intact distally however she has tenderness on the lateral compartment of the lower leg posterior thigh and buttock area   Right knee has excellent range of motion she has mild tenderness in the medial and lateral joint lines and more crepitance and patellofemoral pain surrounding the patella

## 2021-01-29 ENCOUNTER — Telehealth: Payer: Self-pay | Admitting: Orthopedic Surgery

## 2021-01-29 ENCOUNTER — Encounter (HOSPITAL_COMMUNITY): Payer: Self-pay | Admitting: Orthopedic Surgery

## 2021-01-29 ENCOUNTER — Encounter (HOSPITAL_COMMUNITY)
Admission: RE | Disposition: A | Discharge status: Home / Self Care | Payer: Self-pay | Source: Home / Self Care | Attending: Orthopedic Surgery

## 2021-01-29 ENCOUNTER — Ambulatory Visit (HOSPITAL_COMMUNITY): Payer: Medicare HMO | Admitting: Certified Registered"

## 2021-01-29 ENCOUNTER — Observation Stay (HOSPITAL_COMMUNITY)
Admission: RE | Admit: 2021-01-29 | Discharge: 2021-01-30 | Disposition: A | Discharge status: Home / Self Care | Payer: Medicare HMO | Attending: Orthopedic Surgery | Admitting: Orthopedic Surgery

## 2021-01-29 ENCOUNTER — Other Ambulatory Visit: Payer: Self-pay

## 2021-01-29 ENCOUNTER — Ambulatory Visit (HOSPITAL_COMMUNITY): Payer: Medicare HMO

## 2021-01-29 DIAGNOSIS — Z8249 Family history of ischemic heart disease and other diseases of the circulatory system: Secondary | ICD-10-CM | POA: Diagnosis not present

## 2021-01-29 DIAGNOSIS — M545 Low back pain, unspecified: Secondary | ICD-10-CM | POA: Diagnosis not present

## 2021-01-29 DIAGNOSIS — M79671 Pain in right foot: Secondary | ICD-10-CM | POA: Insufficient documentation

## 2021-01-29 DIAGNOSIS — E039 Hypothyroidism, unspecified: Secondary | ICD-10-CM | POA: Diagnosis not present

## 2021-01-29 DIAGNOSIS — M5136 Other intervertebral disc degeneration, lumbar region: Secondary | ICD-10-CM | POA: Diagnosis not present

## 2021-01-29 DIAGNOSIS — M1710 Unilateral primary osteoarthritis, unspecified knee: Secondary | ICD-10-CM

## 2021-01-29 DIAGNOSIS — Z7982 Long term (current) use of aspirin: Secondary | ICD-10-CM | POA: Insufficient documentation

## 2021-01-29 DIAGNOSIS — M5137 Other intervertebral disc degeneration, lumbosacral region: Secondary | ICD-10-CM | POA: Diagnosis not present

## 2021-01-29 DIAGNOSIS — Z471 Aftercare following joint replacement surgery: Secondary | ICD-10-CM | POA: Diagnosis not present

## 2021-01-29 DIAGNOSIS — Z96651 Presence of right artificial knee joint: Secondary | ICD-10-CM | POA: Diagnosis not present

## 2021-01-29 DIAGNOSIS — M1711 Unilateral primary osteoarthritis, right knee: Principal | ICD-10-CM

## 2021-01-29 DIAGNOSIS — I10 Essential (primary) hypertension: Secondary | ICD-10-CM | POA: Insufficient documentation

## 2021-01-29 DIAGNOSIS — Z79899 Other long term (current) drug therapy: Secondary | ICD-10-CM | POA: Diagnosis not present

## 2021-01-29 DIAGNOSIS — M4186 Other forms of scoliosis, lumbar region: Secondary | ICD-10-CM | POA: Insufficient documentation

## 2021-01-29 DIAGNOSIS — G8929 Other chronic pain: Secondary | ICD-10-CM | POA: Diagnosis not present

## 2021-01-29 DIAGNOSIS — Z87891 Personal history of nicotine dependence: Secondary | ICD-10-CM | POA: Insufficient documentation

## 2021-01-29 DIAGNOSIS — G8918 Other acute postprocedural pain: Secondary | ICD-10-CM | POA: Diagnosis not present

## 2021-01-29 HISTORY — PX: TOTAL KNEE ARTHROPLASTY: SHX125

## 2021-01-29 LAB — ABO/RH: ABO/RH(D): AB POS

## 2021-01-29 SURGERY — ARTHROPLASTY, KNEE, TOTAL
Anesthesia: Spinal | Site: Knee | Laterality: Right

## 2021-01-29 MED ORDER — ATORVASTATIN CALCIUM 20 MG PO TABS
20.0000 mg | ORAL_TABLET | Freq: Every day | ORAL | Status: DC
  Administered 2021-01-29: 20 mg via ORAL
  Filled 2021-01-29 (×2): qty 1

## 2021-01-29 MED ORDER — CEFAZOLIN SODIUM-DEXTROSE 2-4 GM/100ML-% IV SOLN
2.0000 g | INTRAVENOUS | Status: AC
  Administered 2021-01-29: 2 g via INTRAVENOUS

## 2021-01-29 MED ORDER — ROPIVACAINE HCL 5 MG/ML IJ SOLN
INTRAMUSCULAR | Status: AC
  Filled 2021-01-29: qty 30

## 2021-01-29 MED ORDER — FLUTICASONE PROPIONATE 50 MCG/ACT NA SUSP: 1.0000 | Freq: Every day | NASAL | Status: DC | PRN

## 2021-01-29 MED ORDER — DEXAMETHASONE SODIUM PHOSPHATE 10 MG/ML IJ SOLN
10.0000 mg | Freq: Once | INTRAMUSCULAR | Status: AC
  Administered 2021-01-30: 10 mg via INTRAVENOUS
  Filled 2021-01-29: qty 1

## 2021-01-29 MED ORDER — POTASSIUM CHLORIDE CRYS ER 20 MEQ PO TBCR
20.0000 meq | EXTENDED_RELEASE_TABLET | Freq: Two times a day (BID) | ORAL | Status: DC
  Administered 2021-01-29 – 2021-01-30 (×3): 20 meq via ORAL
  Filled 2021-01-29 (×3): qty 1

## 2021-01-29 MED ORDER — LISINOPRIL 10 MG PO TABS
20.0000 mg | ORAL_TABLET | Freq: Every day | ORAL | Status: DC
  Administered 2021-01-29 – 2021-01-30 (×2): 20 mg via ORAL
  Filled 2021-01-29 (×2): qty 2

## 2021-01-29 MED ORDER — METHOCARBAMOL 1000 MG/10ML IJ SOLN
500.0000 mg | Freq: Once | INTRAVENOUS | Status: AC
  Administered 2021-01-29: 500 mg via INTRAVENOUS
  Filled 2021-01-29: qty 5

## 2021-01-29 MED ORDER — ALUM & MAG HYDROXIDE-SIMETH 200-200-20 MG/5ML PO SUSP: 30.0000 mL | ORAL | Status: DC | PRN

## 2021-01-29 MED ORDER — METHIMAZOLE 5 MG PO TABS
5.0000 mg | ORAL_TABLET | ORAL | Status: DC
  Administered 2021-01-30: 5 mg via ORAL
  Filled 2021-01-29: qty 1

## 2021-01-29 MED ORDER — CALCIUM CARBONATE ANTACID 500 MG PO CHEW
1.0000 | CHEWABLE_TABLET | Freq: Every day | ORAL | Status: DC
  Administered 2021-01-29 – 2021-01-30 (×2): 200 mg via ORAL
  Filled 2021-01-29 (×2): qty 1

## 2021-01-29 MED ORDER — DOCUSATE SODIUM 100 MG PO CAPS
100.0000 mg | ORAL_CAPSULE | Freq: Two times a day (BID) | ORAL | Status: DC
  Administered 2021-01-29 – 2021-01-30 (×3): 100 mg via ORAL
  Filled 2021-01-29 (×3): qty 1

## 2021-01-29 MED ORDER — METHOCARBAMOL 1000 MG/10ML IJ SOLN
500.0000 mg | Freq: Four times a day (QID) | INTRAVENOUS | Status: DC | PRN
  Filled 2021-01-29: qty 5

## 2021-01-29 MED ORDER — 0.9 % SODIUM CHLORIDE (POUR BTL) OPTIME
TOPICAL | Status: DC | PRN
  Administered 2021-01-29: 1000 mL

## 2021-01-29 MED ORDER — SODIUM CHLORIDE 0.9 % IR SOLN
Status: DC | PRN
  Administered 2021-01-29: 3000 mL

## 2021-01-29 MED ORDER — PREGABALIN 50 MG PO CAPS
50.0000 mg | ORAL_CAPSULE | Freq: Once | ORAL | Status: AC
  Administered 2021-01-29: 50 mg via ORAL
  Filled 2021-01-29: qty 1

## 2021-01-29 MED ORDER — VITAMIN D 25 MCG (1000 UNIT) PO TABS
2000.0000 [IU] | ORAL_TABLET | Freq: Every day | ORAL | Status: DC
  Administered 2021-01-29 – 2021-01-30 (×2): 2000 [IU] via ORAL
  Filled 2021-01-29 (×2): qty 2

## 2021-01-29 MED ORDER — ONDANSETRON HCL 4 MG/2ML IJ SOLN: 4.0000 mg | Freq: Four times a day (QID) | INTRAMUSCULAR | Status: DC | PRN

## 2021-01-29 MED ORDER — PHENOL 1.4 % MT LIQD: 1.0000 | OROMUCOSAL | Status: DC | PRN

## 2021-01-29 MED ORDER — ASPIRIN 81 MG PO CHEW
81.0000 mg | CHEWABLE_TABLET | Freq: Two times a day (BID) | ORAL | Status: DC
  Administered 2021-01-29 – 2021-01-30 (×2): 81 mg via ORAL
  Filled 2021-01-29 (×2): qty 1

## 2021-01-29 MED ORDER — PHENYLEPHRINE HCL (PRESSORS) 10 MG/ML IV SOLN
INTRAVENOUS | Status: DC | PRN
  Administered 2021-01-29: 80 ug via INTRAVENOUS

## 2021-01-29 MED ORDER — METHIMAZOLE 5 MG PO TABS: 10.0000 mg | ORAL_TABLET | ORAL | Status: DC

## 2021-01-29 MED ORDER — DEXAMETHASONE SODIUM PHOSPHATE 10 MG/ML IJ SOLN
INTRAMUSCULAR | Status: AC
  Filled 2021-01-29: qty 1

## 2021-01-29 MED ORDER — ZOLPIDEM TARTRATE 5 MG PO TABS
5.0000 mg | ORAL_TABLET | Freq: Every day | ORAL | Status: DC
  Administered 2021-01-29: 5 mg via ORAL
  Filled 2021-01-29: qty 1

## 2021-01-29 MED ORDER — METOCLOPRAMIDE HCL 10 MG PO TABS: 5.0000 mg | ORAL_TABLET | Freq: Three times a day (TID) | ORAL | Status: DC | PRN

## 2021-01-29 MED ORDER — DEXAMETHASONE SODIUM PHOSPHATE 10 MG/ML IJ SOLN
INTRAMUSCULAR | Status: DC | PRN
  Administered 2021-01-29: 5 mg via INTRAVENOUS

## 2021-01-29 MED ORDER — CEFAZOLIN SODIUM-DEXTROSE 2-4 GM/100ML-% IV SOLN
2.0000 g | Freq: Four times a day (QID) | INTRAVENOUS | Status: AC
  Administered 2021-01-29 (×2): 2 g via INTRAVENOUS
  Filled 2021-01-29 (×2): qty 100

## 2021-01-29 MED ORDER — PROPOFOL 1000 MG/100ML IV EMUL
INTRAVENOUS | Status: AC
  Filled 2021-01-29: qty 100

## 2021-01-29 MED ORDER — ONDANSETRON HCL 4 MG PO TABS: 4.0000 mg | ORAL_TABLET | Freq: Four times a day (QID) | ORAL | Status: DC | PRN

## 2021-01-29 MED ORDER — MORPHINE SULFATE (PF) 2 MG/ML IV SOLN
0.5000 mg | INTRAVENOUS | Status: DC | PRN
Start: 2021-01-29 — End: 2021-01-30

## 2021-01-29 MED ORDER — ONDANSETRON HCL 4 MG/2ML IJ SOLN
4.0000 mg | Freq: Once | INTRAMUSCULAR | Status: AC
  Administered 2021-01-29: 4 mg via INTRAVENOUS
  Filled 2021-01-29: qty 2

## 2021-01-29 MED ORDER — ACETAMINOPHEN 500 MG PO TABS
500.0000 mg | ORAL_TABLET | Freq: Four times a day (QID) | ORAL | Status: AC
  Administered 2021-01-29 – 2021-01-30 (×4): 500 mg via ORAL
  Filled 2021-01-29 (×4): qty 1

## 2021-01-29 MED ORDER — PANTOPRAZOLE SODIUM 40 MG PO TBEC
40.0000 mg | DELAYED_RELEASE_TABLET | Freq: Every day | ORAL | Status: DC
  Administered 2021-01-30: 40 mg via ORAL
  Filled 2021-01-29: qty 1

## 2021-01-29 MED ORDER — BUPIVACAINE-MELOXICAM ER 200-6 MG/7ML IJ SOLN
INTRAMUSCULAR | Status: DC | PRN
  Administered 2021-01-29: 14 mL

## 2021-01-29 MED ORDER — MENTHOL 3 MG MT LOZG: 1.0000 | LOZENGE | OROMUCOSAL | Status: DC | PRN

## 2021-01-29 MED ORDER — PHENYLEPHRINE 40 MCG/ML (10ML) SYRINGE FOR IV PUSH (FOR BLOOD PRESSURE SUPPORT)
PREFILLED_SYRINGE | INTRAVENOUS | Status: AC
  Filled 2021-01-29: qty 10

## 2021-01-29 MED ORDER — HYDROCODONE-ACETAMINOPHEN 5-325 MG PO TABS
1.0000 | ORAL_TABLET | ORAL | Status: DC | PRN
  Administered 2021-01-29 – 2021-01-30 (×2): 1 via ORAL
  Filled 2021-01-29: qty 1
  Filled 2021-01-29: qty 2
  Filled 2021-01-29: qty 1

## 2021-01-29 MED ORDER — FENTANYL CITRATE (PF) 100 MCG/2ML IJ SOLN: 25.0000 ug | INTRAMUSCULAR | Status: DC | PRN

## 2021-01-29 MED ORDER — ATENOLOL 25 MG PO TABS
50.0000 mg | ORAL_TABLET | Freq: Every day | ORAL | Status: DC
  Administered 2021-01-30: 50 mg via ORAL
  Filled 2021-01-29: qty 2

## 2021-01-29 MED ORDER — ORAL CARE MOUTH RINSE: 15.0000 mL | Freq: Once | OROMUCOSAL | Status: AC

## 2021-01-29 MED ORDER — SODIUM CHLORIDE 0.9 % IV SOLN: INTRAVENOUS | Status: AC

## 2021-01-29 MED ORDER — MEPERIDINE HCL 50 MG/ML IJ SOLN
INTRAMUSCULAR | Status: AC
  Filled 2021-01-29: qty 1

## 2021-01-29 MED ORDER — PROPOFOL 500 MG/50ML IV EMUL
INTRAVENOUS | Status: DC | PRN
  Administered 2021-01-29: 35 ug/kg/min via INTRAVENOUS

## 2021-01-29 MED ORDER — LIDOCAINE HCL (CARDIAC) PF 50 MG/5ML IV SOSY
PREFILLED_SYRINGE | INTRAVENOUS | Status: DC | PRN
  Administered 2021-01-29 (×2): 1 mL via INTRAVENOUS

## 2021-01-29 MED ORDER — METHOCARBAMOL 500 MG PO TABS
500.0000 mg | ORAL_TABLET | Freq: Four times a day (QID) | ORAL | Status: DC | PRN
  Administered 2021-01-29: 500 mg via ORAL
  Filled 2021-01-29: qty 1

## 2021-01-29 MED ORDER — ACETAMINOPHEN 325 MG PO TABS
325.0000 mg | ORAL_TABLET | Freq: Four times a day (QID) | ORAL | Status: DC | PRN
  Administered 2021-01-30: 650 mg via ORAL
  Filled 2021-01-29: qty 2

## 2021-01-29 MED ORDER — BISACODYL 5 MG PO TBEC: 5.0000 mg | DELAYED_RELEASE_TABLET | Freq: Every day | ORAL | Status: DC | PRN

## 2021-01-29 MED ORDER — CEFAZOLIN SODIUM-DEXTROSE 2-4 GM/100ML-% IV SOLN
INTRAVENOUS | Status: AC
  Filled 2021-01-29: qty 100

## 2021-01-29 MED ORDER — MIDAZOLAM HCL 2 MG/2ML IJ SOLN
INTRAMUSCULAR | Status: AC
  Filled 2021-01-29: qty 2

## 2021-01-29 MED ORDER — 3-IN-1 BEDSIDE TOILET MISC
0 refills | Status: DC
Start: 2021-01-29 — End: 2021-01-30

## 2021-01-29 MED ORDER — CELECOXIB 400 MG PO CAPS
400.0000 mg | ORAL_CAPSULE | Freq: Once | ORAL | Status: AC
  Administered 2021-01-29: 400 mg via ORAL
  Filled 2021-01-29: qty 1

## 2021-01-29 MED ORDER — MIDAZOLAM HCL 5 MG/5ML IJ SOLN
INTRAMUSCULAR | Status: DC | PRN
  Administered 2021-01-29: 2 mg via INTRAVENOUS

## 2021-01-29 MED ORDER — BUPIVACAINE LIPOSOME 1.3 % IJ SUSP
INTRAMUSCULAR | Status: AC
  Filled 2021-01-29: qty 20

## 2021-01-29 MED ORDER — ROPIVACAINE HCL 5 MG/ML IJ SOLN
INTRAMUSCULAR | Status: DC | PRN
  Administered 2021-01-29: 10 mL via EPIDURAL
  Administered 2021-01-29: 20 mL via EPIDURAL

## 2021-01-29 MED ORDER — TRANEXAMIC ACID-NACL 1000-0.7 MG/100ML-% IV SOLN
1000.0000 mg | INTRAVENOUS | Status: AC
  Administered 2021-01-29: 1000 mg via INTRAVENOUS

## 2021-01-29 MED ORDER — SENNOSIDES-DOCUSATE SODIUM 8.6-50 MG PO TABS: 1.0000 | ORAL_TABLET | Freq: Every evening | ORAL | Status: DC | PRN

## 2021-01-29 MED ORDER — ESCITALOPRAM OXALATE 10 MG PO TABS
20.0000 mg | ORAL_TABLET | Freq: Every day | ORAL | Status: DC
  Administered 2021-01-30: 20 mg via ORAL
  Filled 2021-01-29: qty 2

## 2021-01-29 MED ORDER — CHLORHEXIDINE GLUCONATE 0.12 % MT SOLN
15.0000 mL | Freq: Once | OROMUCOSAL | Status: AC
  Administered 2021-01-29: 15 mL via OROMUCOSAL

## 2021-01-29 MED ORDER — ONDANSETRON HCL 4 MG/2ML IJ SOLN: 4.0000 mg | Freq: Once | INTRAMUSCULAR | Status: DC | PRN

## 2021-01-29 MED ORDER — DIPHENHYDRAMINE HCL 12.5 MG/5ML PO ELIX: 12.5000 mg | ORAL_SOLUTION | ORAL | Status: DC | PRN

## 2021-01-29 MED ORDER — TRAMADOL HCL 50 MG PO TABS
50.0000 mg | ORAL_TABLET | Freq: Four times a day (QID) | ORAL | Status: DC
  Administered 2021-01-29 – 2021-01-30 (×5): 50 mg via ORAL
  Filled 2021-01-29 (×5): qty 1

## 2021-01-29 MED ORDER — TRANEXAMIC ACID-NACL 1000-0.7 MG/100ML-% IV SOLN
1000.0000 mg | Freq: Once | INTRAVENOUS | Status: AC
  Administered 2021-01-29: 1000 mg via INTRAVENOUS
  Filled 2021-01-29: qty 100

## 2021-01-29 MED ORDER — BUPIVACAINE IN DEXTROSE 0.75-8.25 % IT SOLN
INTRATHECAL | Status: DC | PRN
  Administered 2021-01-29: 2 mL via INTRATHECAL

## 2021-01-29 MED ORDER — BUPIVACAINE-EPINEPHRINE (PF) 0.5% -1:200000 IJ SOLN
INTRAMUSCULAR | Status: AC
  Filled 2021-01-29: qty 30

## 2021-01-29 MED ORDER — MEPERIDINE HCL 50 MG/ML IJ SOLN
12.5000 mg | Freq: Once | INTRAMUSCULAR | Status: AC
Start: 2021-01-29 — End: 2021-01-29
  Administered 2021-01-29: 12.5 mg via INTRAVENOUS

## 2021-01-29 MED ORDER — LACTATED RINGERS IV SOLN
INTRAVENOUS | Status: DC
  Administered 2021-01-29: 1000 mL via INTRAVENOUS

## 2021-01-29 MED ORDER — METOCLOPRAMIDE HCL 5 MG/ML IJ SOLN: 5.0000 mg | Freq: Three times a day (TID) | INTRAMUSCULAR | Status: DC | PRN

## 2021-01-29 MED ORDER — HYDROCODONE-ACETAMINOPHEN 5-325 MG PO TABS
1.0000 | ORAL_TABLET | Freq: Once | ORAL | Status: AC
  Administered 2021-01-29: 1 via ORAL
  Filled 2021-01-29: qty 1

## 2021-01-29 MED ORDER — CELECOXIB 100 MG PO CAPS
200.0000 mg | ORAL_CAPSULE | Freq: Two times a day (BID) | ORAL | Status: DC
  Administered 2021-01-29 – 2021-01-30 (×3): 200 mg via ORAL
  Filled 2021-01-29 (×3): qty 2

## 2021-01-29 MED ORDER — TRANEXAMIC ACID-NACL 1000-0.7 MG/100ML-% IV SOLN
INTRAVENOUS | Status: AC
  Filled 2021-01-29: qty 100

## 2021-01-29 MED ORDER — LIDOCAINE HCL (PF) 2 % IJ SOLN
INTRAMUSCULAR | Status: AC
  Filled 2021-01-29: qty 5

## 2021-01-29 SURGICAL SUPPLY — 64 items
ATTUNE MED DOME PAT 32 KNEE (Knees) ×2 IMPLANT
ATTUNE MED DOME PAT 32MM KNEE (Knees) ×1 IMPLANT
ATTUNE PS FEM RT SZ 3 CEM KNEE (Femur) ×3 IMPLANT
BANDAGE ESMARK 6X9 LF (GAUZE/BANDAGES/DRESSINGS) ×1 IMPLANT
BASEPLATE TIB CMT FB PCKT SZ3 (Knees) ×3 IMPLANT
BLADE SAGITTAL 25.0X1.27X90 (BLADE) ×2 IMPLANT
BLADE SAGITTAL 25.0X1.27X90MM (BLADE) ×1
BLADE SAW SGTL 11.0X1.19X90.0M (BLADE) ×3 IMPLANT
BNDG ELASTIC 4X5.8 VLCR NS LF (GAUZE/BANDAGES/DRESSINGS) ×6 IMPLANT
BNDG ELASTIC 4X5.8 VLCR STR LF (GAUZE/BANDAGES/DRESSINGS) ×6 IMPLANT
BNDG ELASTIC 6X5.8 VLCR NS LF (GAUZE/BANDAGES/DRESSINGS) ×3 IMPLANT
BNDG ELASTIC 6X5.8 VLCR STR LF (GAUZE/BANDAGES/DRESSINGS) ×3 IMPLANT
BNDG ESMARK 6X9 LF (GAUZE/BANDAGES/DRESSINGS) ×3
CEMENT HV SMART SET (Cement) ×6 IMPLANT
CLOTH BEACON ORANGE TIMEOUT ST (SAFETY) ×3 IMPLANT
COOLER ICEMAN CLASSIC (MISCELLANEOUS) ×3 IMPLANT
COVER LIGHT HANDLE STERIS (MISCELLANEOUS) ×6 IMPLANT
COVER WAND RF STERILE (DRAPES) ×3 IMPLANT
CUFF TOURN SGL QUICK 34 (TOURNIQUET CUFF) ×2
CUFF TRNQT CYL 34X4.125X (TOURNIQUET CUFF) ×1 IMPLANT
DRAPE BACK TABLE (DRAPES) ×3 IMPLANT
DRAPE EXTREMITY T 121X128X90 (DISPOSABLE) ×3 IMPLANT
DRESSING AQUACEL AG ADV 3.5X12 (MISCELLANEOUS) ×1 IMPLANT
DRSG AQUACEL AG ADV 3.5X12 (MISCELLANEOUS) ×3
DURAPREP 26ML APPLICATOR (WOUND CARE) ×6 IMPLANT
ELECT REM PT RETURN 9FT ADLT (ELECTROSURGICAL) ×3
ELECTRODE REM PT RTRN 9FT ADLT (ELECTROSURGICAL) ×1 IMPLANT
GLOVE SKINSENSE NS SZ8.0 LF (GLOVE) ×4
GLOVE SKINSENSE STRL SZ8.0 LF (GLOVE) ×2 IMPLANT
GLOVE SS N UNI LF 8.5 STRL (GLOVE) ×3 IMPLANT
GLOVE SURG UNDER POLY LF SZ7 (GLOVE) ×6 IMPLANT
GOWN STRL REUS W/TWL LRG LVL3 (GOWN DISPOSABLE) ×9 IMPLANT
GOWN STRL REUS W/TWL XL LVL3 (GOWN DISPOSABLE) ×3 IMPLANT
HANDPIECE INTERPULSE COAX TIP (DISPOSABLE) ×2
HOOD W/PEELAWAY (MISCELLANEOUS) ×12 IMPLANT
INSERT TIB ATTUNE FB SZ3X5 (Insert) ×3 IMPLANT
INST SET MAJOR BONE (KITS) ×3 IMPLANT
IV NS IRRIG 3000ML ARTHROMATIC (IV SOLUTION) ×3 IMPLANT
KIT BLADEGUARD II DBL (SET/KITS/TRAYS/PACK) ×3 IMPLANT
KIT TURNOVER KIT A (KITS) ×3 IMPLANT
MANIFOLD NEPTUNE II (INSTRUMENTS) ×3 IMPLANT
MARKER SKIN DUAL TIP RULER LAB (MISCELLANEOUS) ×3 IMPLANT
NS IRRIG 1000ML POUR BTL (IV SOLUTION) ×3 IMPLANT
PACK TOTAL JOINT (CUSTOM PROCEDURE TRAY) ×3 IMPLANT
PAD ARMBOARD 7.5X6 YLW CONV (MISCELLANEOUS) ×3 IMPLANT
PAD COLD SHLDR SM WRAP-ON (PAD) ×3 IMPLANT
PENCIL SMOKE EVACUATOR (MISCELLANEOUS) ×3 IMPLANT
PILLOW KNEE EXTENSION 0 DEG (MISCELLANEOUS) ×3 IMPLANT
PIN/DRILL PACK ORTHO 1/8X3.0 (PIN) ×3 IMPLANT
SAW OSC TIP CART 19.5X105X1.3 (SAW) ×3 IMPLANT
SET BASIN LINEN APH (SET/KITS/TRAYS/PACK) ×3 IMPLANT
SET HNDPC FAN SPRY TIP SCT (DISPOSABLE) ×1 IMPLANT
SPONGE LAP 18X18 RF (DISPOSABLE) ×3 IMPLANT
STAPLER VISISTAT 35W (STAPLE) ×3 IMPLANT
SUT BRALON NAB BRD #1 30IN (SUTURE) ×6 IMPLANT
SUT MNCRL 0 VIOLET CTX 36 (SUTURE) ×1 IMPLANT
SUT MON AB 0 CT1 (SUTURE) ×3 IMPLANT
SUT MONOCRYL 0 CTX 36 (SUTURE) ×2
SYR BULB IRRIG 60ML STRL (SYRINGE) ×3 IMPLANT
TOWEL OR 17X26 4PK STRL BLUE (TOWEL DISPOSABLE) ×3 IMPLANT
TOWER CARTRIDGE SMART MIX (DISPOSABLE) ×3 IMPLANT
TRAY FOLEY MTR SLVR 16FR STAT (SET/KITS/TRAYS/PACK) ×3 IMPLANT
WATER STERILE IRR 1000ML POUR (IV SOLUTION) ×6 IMPLANT
YANKAUER SUCT 12FT TUBE ARGYLE (SUCTIONS) ×3 IMPLANT

## 2021-01-29 NOTE — Anesthesia Procedure Notes (Signed)
Anesthesia Regional Block: Adductor canal block   Pre-Anesthetic Checklist: , timeout performed,  Correct Patient, Correct Site, Correct Laterality,  Correct Procedure, Correct Position, site marked,  Risks and benefits discussed,  Surgical consent,  Pre-op evaluation,  At surgeon's request and post-op pain management  Laterality: Right  Prep: chloraprep       Needles:  Injection technique: Single-shot  Needle Type: Stimiplex     Needle Length: 15cm  Needle Gauge: 20     Additional Needles:   Procedures:,,,, ultrasound used (permanent image in chart),,    Narrative:  Start time: 01/29/2021 7:13 AM End time: 01/29/2021 7:18 AM Injection made incrementally with aspirations every 5 mL.  Performed by: With CRNAs  Anesthesiologist: Louann Sjogren, MD CRNA: Tacy Learn, CRNA

## 2021-01-29 NOTE — Plan of Care (Addendum)
  Problem: Acute Rehab PT Goals(only PT should resolve) Goal: Pt Will Go Supine/Side To Sit Outcome: Progressing Flowsheets (Taken 01/29/2021 1554) Pt will go Supine/Side to Sit: Independently Goal: Patient Will Perform Sitting Balance Outcome: Progressing Flowsheets (Taken 01/29/2021 1554) Patient will perform sitting balance: Independently Goal: Patient Will Transfer Sit To/From Stand Outcome: Progressing Flowsheets (Taken 01/29/2021 1554) Patient will transfer sit to/from stand: Independently Goal: Pt Will Transfer Bed To Chair/Chair To Bed Outcome: Progressing Flowsheets (Taken 01/29/2021 1554) Pt will Transfer Bed to Chair/Chair to Bed: Independently Goal: Pt Will Ambulate Outcome: Progressing Flowsheets (Taken 01/29/2021 1554) Pt will Ambulate:  > 125 feet  Independently  with rolling walker Goal: Pt Will Go Up/Down Stairs Outcome: Progressing Flowsheets (Taken 01/29/2021 1554) Pt will Go Up / Down Stairs:  3-5 stairs  with supervision  with rail(s)    3:55 PM, 01/29/21 Sinclair Ship SPT  3:57 PM, 01/29/21 [name], MPT Physical Therapist with [facility] [phone] office [phone] mobile phone

## 2021-01-29 NOTE — Evaluation (Addendum)
Physical Therapy Evaluation Patient Details Name: Cindy Hopkins MRN: [id number] DOB: 04/05/1941 Today's Date: 01/29/2021  RIGHT KNEE ROM: 0 - 90 degrees AMBULATION DISTANCE: 52 feet with RW     History of Present Illness  Cindy Hopkins V.  has presented today for surgery, with the diagnosis of right knee osteoarthritis.  The various methods of treatment have been discussed with the patient and family. After consideration of risks, benefits and other options for treatment, the patient has consented to  Procedure(s):  RIGHT TOTAL KNEE ARTHROPLASTY (Right) as a surgical intervention.  The patient's history has been reviewed, patient examined, no change in status, stable for surgery.  I have reviewed the patient's chart and labs.  Questions were answered to the patient's satisfaction.   Clinical Impression  Patient presents in bed and agreeable to therapy. Therapeutic exercises in bed included: quad sets, ankle pumps, heel slides, and short arc quads. Patient demonstrated good bed mobility and balance at EOB. Patient was able to ambulate 50 feet with RW, patient required verbal cues on hand placement during transfer and while making turns with RW. Patient demonstrate good return to chair after therapy. Patient will benefit from continued physical therapy in hospital and recommended venue below to increase strength, balance, endurance for safe ADLs and gait.     Follow Up Recommendations Outpatient PT    Equipment Recommendations  None recommended by PT    Recommendations for Other Services       Precautions / Restrictions Precautions Precautions: Fall Restrictions Weight Bearing Restrictions: Yes RUE Weight Bearing: Weight bearing as tolerated      Mobility  Bed Mobility Overal bed mobility: Modified Independent             General bed mobility comments: Slow labored movement and increased time Patient Response: Cooperative  Transfers Overall transfer level: Modified  independent Equipment used: Rolling walker (2 wheeled)             General transfer comment: required verbal cues for gait pattern  Ambulation/Gait Ambulation/Gait assistance: Min Gaffer (Feet): 50 Feet Assistive device: Rolling walker (2 wheeled) Gait Pattern/deviations: Step-to pattern;Decreased step length - left;Decreased stance time - right Gait velocity: decreased   General Gait Details: Step to gait pattern for RLE, decreased WB tolerance on RLE due to pain  Stairs            Wheelchair Mobility    Modified Rankin (Stroke Patients Only)       Balance Overall balance assessment: Needs assistance Sitting-balance support: Feet supported Sitting balance-Leahy Scale: Normal Sitting balance - Comments: at EOB   Standing balance support: Bilateral upper extremity supported;During functional activity Standing balance-Leahy Scale: Fair Standing balance comment: fair/poor due to pain, use of RW                             Pertinent Vitals/Pain Pain Assessment: Faces Faces Pain Scale: Hurts little more Pain Location: Right knee Pain Descriptors / Indicators: Aching;Sore;Sharp;Guarding Pain Intervention(s): Limited activity within patient's tolerance;Monitored during session;Repositioned    Home Living Family/patient expects to be discharged to:: Private residence Living Arrangements: Alone Available Help at Discharge: Family;Available 24 hours/day Type of Home: House Home Access: Ramped entrance     Home Layout: One level Home Equipment: Walker - 2 wheels;Cane - quad      Prior Function Level of Independence: Independent         Comments: Patient was community ambulator  Hand Dominance        Extremity/Trunk Assessment   Upper Extremity Assessment Upper Extremity Assessment: Overall WFL for tasks assessed    Lower Extremity Assessment Lower Extremity Assessment: RLE deficits/detail RLE Deficits /  Details: R TKE, decrease WB tolerance and ROM    Cervical / Trunk Assessment Cervical / Trunk Assessment: Kyphotic  Communication   Communication: No difficulties  Cognition Arousal/Alertness: Awake/alert Behavior During Therapy: WFL for tasks assessed/performed Overall Cognitive Status: Within Functional Limits for tasks assessed                                        General Comments      Exercises Total Joint Exercises Ankle Circles/Pumps: Supine;AROM;Strengthening;Both;20 reps Quad Sets: AROM;Strengthening;10 reps;Supine;Right Short Arc Quad: Supine;AROM;Strengthening;5 reps;Right Heel Slides: Seated;Supine;AROM;Right;10 reps Knee Flexion: AAROM;Seated;Right;10 reps Goniometric ROM: 0 - 90 deg R   Assessment/Plan    PT Assessment Patient needs continued PT services  PT Problem List Decreased strength;Decreased range of motion;Decreased activity tolerance;Decreased balance;Decreased coordination;Decreased mobility       PT Treatment Interventions DME instruction;Gait training;Stair training;Therapeutic exercise;Therapeutic activities;Functional mobility training;Balance training;Patient/family education    PT Goals (Current goals can be found in the Care Plan section)  Acute Rehab PT Goals Patient Stated Goal: return home PT Goal Formulation: With patient Time For Goal Achievement: 02/12/21 Potential to Achieve Goals: Good    Frequency Min 3X/week   Barriers to discharge        Co-evaluation               AM-PAC PT "6 Clicks" Mobility  Outcome Measure Help needed turning from your back to your side while in a flat bed without using bedrails?: None Help needed moving from lying on your back to sitting on the side of a flat bed without using bedrails?: None Help needed moving to and from a bed to a chair (including a wheelchair)?: A Little Help needed standing up from a chair using your arms (e.g., wheelchair or bedside chair)?: A  Little Help needed to walk in hospital room?: A Little Help needed climbing 3-5 steps with a railing? : A Little 6 Click Score: 20    End of Session   Activity Tolerance: Patient tolerated treatment well;Patient limited by pain Patient left: in chair;with call bell/phone within reach Nurse Communication: Mobility status PT Visit Diagnosis: Unsteadiness on feet (R26.81);Other abnormalities of gait and mobility (R26.89);Muscle weakness (generalized) (M62.81)    Time: 9628-3662 PT Time Calculation (min) (ACUTE ONLY): 30 min   Charges:   PT Evaluation $PT Eval Moderate Complexity: 1 Mod PT Treatments $Therapeutic Activity: 23-37 mins      3:55 PM, 01/29/21 [name] SPT  3:55 PM, 01/29/21 [name], MPT Physical Therapist with [facility] [phone] office [phone] mobile phone

## 2021-01-29 NOTE — TOC Initial Note (Addendum)
TOC consulted for [facility] and DME needs. CSW made call to Marjory Lies with [employer] who states he has all he needs and HH has been set up for pt. CSW updated by Dr. Aline Brochure that pt has had CMP and walker ordered, however she may also need a 3-N-1. CSW reached out to Dr. Ruthe Mannan office to inquire about what company to inform 3N1 was needed, CSW was informed Amy, clinical assistant for Dr. Aline Brochure would be informed of CSW message. TOC to follow.

## 2021-01-29 NOTE — Op Note (Signed)
Dictation for total knee replacement  01/29/2021  9:44 AM  Preop diagnosis osteoarthritis right KNEE  Postop diagnosis osteoarthritis right KNEE  Procedure right total knee arthroplasty  Implants   DePuy   Attune, fixed-bearing posterior stabilized total knee: Sizes: Femur   3   tibia 3   patella 32   POLYthylene 5  Bone cuts:  Distal femur 9  PROXIMAL TIBIA 5 medial reference  PATELLA patella measured 17 cut to 10 replaced with a 32 x 9 button         ASSISTANTS: Corrie Dandy and Zoila Shutter  ANESTHESIA:   Preop saphenous nerve block spinal anesthetic  BLOOD ADMINISTERED:none  DRAINS: none   LOCAL MEDICATIONS USED: Zynrelef 2 vials SPECIMEN:  No Specimen  DISPOSITION OF SPECIMEN:  N/A  COUNTS:  YES  TOURNIQUET: 88 minutes 280 mmHg  DICTATION: .Dragon Dictation   The patient was taken to the recovery room in stable condition  PLAN OF CARE: Observation admission  PATIENT DISPOSITION:  PACU - hemodynamically stable.   Delay start of Pharmacological VTE agent (>24hrs) due to surgical blood loss or risk of bleeding: not applicable  Details of surgery: The patient was identified by 2 approved identification mechanisms. The operative extremity was evaluated and found to be acceptable for surgical treatment today. The chart was reviewed. The surgical site was confirmed and marked. The patient had a preop saphenous nerve block   The patient was taken to the operating room and given appropriate antibiotic 2 g Ancef. This is consistent with the SCIP protocol.  The patient was given the following anesthetic: Spinal  The patient was then placed supine on the operating table. A Foley catheter was inserted. The operative extremity was prepped and draped sterilely from the toes to the groin.  Timeout was executed confirming the patient's name, surgical site, antibiotic administration, x-rays available, and implants available.  The operative limb,  was exsanguinated  with a six-inch Esmarch and the tourniquet was inflated to 280 mmHg.  A straight midline incision was made over the right KNEE and taken down to the extensor mechanism. A medial arthrotomy was performed. The patella was everted and the patellofemoral soft tissue was released, along with the patellar fat pad.  The anterior cruciate ligament and PCL were resected.  The anterior horns of the lateral and medial meniscus were resected. The medial soft tissue sleeve was elevated to the mid coronal plane.  A three-eighths inch drill bit was used to enter the femoral canal which was decompressed with suction and irrigation until clear.   The distal femoral cutting guide was set for 9 mm distal resection,  5valgus alignment, for a right knee. The distal femur was resected and checked for flatness.  The Depuy Sigma sizing femoral guide was placed and the femur was sized to a size 3.   The external alignment guide for the tibial resection was then applied to the distal and proximal tibia and set for anatomic slope along with 5 MM resection  from the medial tibia.   Rotational alignment was set using the malleolus, the tibial tubercle and the tibial spines.  The proximal tibia was resected along with  residual menisci. The tibia was sized using a base plate to a size 3.   The extension gap was checked.  5 fit well with good stability in extension   A 4-in-1 cutting block was placed along with collateral ligament retractors and the distal femoral cuts were completed.  Spacer blocks were used to confirm  equal flexion extension gaps with releases done as needed. A size 5 MM spacer block gave equal stability and flexion extension.  The correct sized notch cutting guide for the femur was then applied and the notch cut was made.  Trial reduction was completed using size size 3 femur size 3 tibia size 5 polyethylene insert trial implants. Patella tracking was normal  We then skeletonized the patella. It  measured 17 in thickness and the patellar was resected freehand tendon to tendon resulting in a 10 mm patella  The patella diameter measured for 32 button . We then drilled the peg holes for the patella.  Completed patellar thickness was 19 mm  The proximal tibia was prepared using the size 3 base plate.  Thorough irrigation was performed and the bone was dried and prepared for cement. The cement was mixed on the back table using third generation preparation techniques    The implants were then cemented in place and excess cement was removed. The cement was allowed to cure. Irrigation was repeated and excess and residual bone fragments and cement were removed.  The joint was dried with a lap sponge and 2 vials of Zynrelef were placed  The extensor mechanism was closed with #1 Bralon suture followed by subcutaneous tissue closure using 0 Monocryl suture   Skin approximation was performed using staples  A sterile dressing was applied, followed by a ace wrapped from foot to thigh and then a Cryo/Cuff which was activated   The patient was taken recovery room in stable condition

## 2021-01-29 NOTE — Anesthesia Postprocedure Evaluation (Signed)
Anesthesia Post Note  Patient: Ethyl Vila.  Procedure(s) Performed: RIGHT TOTAL KNEE ARTHROPLASTY (Right: Knee)  Patient location during evaluation: Phase II Anesthesia Type: Spinal Level of consciousness: awake Pain management: pain level controlled Vital Signs Assessment: post-procedure vital signs reviewed and stable Respiratory status: spontaneous breathing and respiratory function stable Cardiovascular status: blood pressure returned to baseline and stable Postop Assessment: no headache and no apparent nausea or vomiting Anesthetic complications: no Comments: Late entry   No notable events documented.   Last Vitals:  Vitals:   01/29/21 0724 01/29/21 0942  BP: (!) 168/92 (!) 126/53  Pulse:  80  Resp:    Temp:  36.4 C  SpO2: 100% 98%    Last Pain:  Vitals:   01/29/21 0658  TempSrc: Oral  PainSc: 0-No pain                 Louann Sjogren

## 2021-01-29 NOTE — Anesthesia Procedure Notes (Signed)
Spinal  Start time: 01/29/2021 7:26 AM End time: 01/29/2021 7:31 AM Reason for block: surgical anesthesia Staffing Performed: resident/CRNA  Resident/CRNA: Tacy Learn, CRNA Preanesthetic Checklist Completed: patient identified, IV checked, site marked, risks and benefits discussed, surgical consent, monitors and equipment checked, pre-op evaluation and timeout performed Spinal Block Patient position: sitting Prep: ChloraPrep Patient monitoring: heart rate, cardiac monitor, continuous pulse ox and blood pressure Approach: midline Location: L3-4 Injection technique: single-shot Needle Needle type: Quincke  Needle gauge: 22 G Needle length: 9 cm Assessment Sensory level: T4 Events: CSF return Additional Notes Pt tolerated well

## 2021-01-29 NOTE — Telephone Encounter (Signed)
Call received from [place], Education officer, museum at [facility], [phone], inquiring about an order for durable medical equipment, which she relays she had messaged to Dr Aline Brochure, but understood that she was to follow up with the office. Order is to be for a 3-in-one chair.

## 2021-01-29 NOTE — Transfer of Care (Signed)
Immediate Anesthesia Transfer of Care Note  Patient: Cindy Hopkins.  Procedure(s) Performed: RIGHT TOTAL KNEE ARTHROPLASTY (Right: Knee)  Patient Location: PACU  Anesthesia Type:Spinal  Level of Consciousness: awake, alert , oriented and patient cooperative  Airway & Oxygen Therapy: Patient Spontanous Breathing  Post-op Assessment: Report given to RN, Post -op Vital signs reviewed and stable and Patient moving all extremities  Post vital signs: Reviewed and stable  Last Vitals:  Vitals Value Taken Time  BP 126/53 01/29/21 0943  Temp    Pulse 80 01/29/21 0943  Resp 17 01/29/21 0944  SpO2 98 % 01/29/21 0943  Vitals shown include unvalidated device data.  Last Pain:  Vitals:   01/29/21 0658  TempSrc: Oral  PainSc: 0-No pain      Patients Stated Pain Goal: 6 (97/58/83 2549)  Complications: No notable events documented.

## 2021-01-29 NOTE — Brief Op Note (Signed)
01/29/2021  9:42 AM  PATIENT:  Cindy Hopkins  80 y.o. female  PRE-OPERATIVE DIAGNOSIS:  right knee osteoarthritis  POST-OPERATIVE DIAGNOSIS:  right knee osteoarthritis  PROCEDURE:  Procedure(s): RIGHT TOTAL KNEE ARTHROPLASTY (Right)  SURGEON:  Surgeon(s) and Role:    Carole Civil, MD - Primary  PHYSICIAN ASSISTANT:   ASSISTANTS: Corrie Dandy and Zoila Shutter  ANESTHESIA:   spinal and saphenous nerve block  EBL:  min   BLOOD ADMINISTERED:none  DRAINS: none   LOCAL MEDICATIONS USED:  OTHER Zynrelef 2 vials  SPECIMEN:  No Specimen  DISPOSITION OF SPECIMEN:  N/A  COUNTS:  YES  TOURNIQUET:   Total Tourniquet Time Documented: Thigh (Right) - 88 minutes Total: Thigh (Right) - 88 minutes   DICTATION: .Viviann Spare Dictation  PLAN OF CARE: Admit for overnight observation  PATIENT DISPOSITION:  PACU - hemodynamically stable.   Delay start of Pharmacological VTE agent (>24hrs) due to surgical blood loss or risk of bleeding: yes

## 2021-01-29 NOTE — Interval H&P Note (Signed)
History and Physical Interval Note:  01/29/2021 7:18 AM  Cindy Revels V.  has presented today for surgery, with the diagnosis of right knee osteoarthritis.  The various methods of treatment have been discussed with the patient and family. After consideration of risks, benefits and other options for treatment, the patient has consented to  Procedure(s): RIGHT TOTAL KNEE ARTHROPLASTY (Right) as a surgical intervention.  The patient's history has been reviewed, patient examined, no change in status, stable for surgery.  I have reviewed the patient's chart and labs.  Questions were answered to the patient's satisfaction.     [name]

## 2021-01-29 NOTE — Telephone Encounter (Signed)
[facility] called at 3:55 left message stating they need the diagnosis code and insurance information for this patient.   Please call the Home Health side at [phone]

## 2021-01-29 NOTE — Telephone Encounter (Signed)
Sent the order to [facility] and will need to send another with original signature to them. It is waiting on signature. I have advised Aldona Bar this is done.

## 2021-01-29 NOTE — Anesthesia Preprocedure Evaluation (Signed)
Anesthesia Evaluation  Patient identified by MRN, date of birth, ID band Patient awake    Reviewed: Allergy & Precautions, H&P , NPO status , Patient's Chart, lab work & pertinent test results, reviewed documented beta blocker date and time   Airway Mallampati: II  TM Distance: >3 FB Neck ROM: full    Dental no notable dental hx.    Pulmonary neg pulmonary ROS, former smoker,    Pulmonary exam normal breath sounds clear to auscultation       Cardiovascular Exercise Tolerance: Good hypertension, negative cardio ROS   Rhythm:regular Rate:Normal     Neuro/Psych PSYCHIATRIC DISORDERS Anxiety Depression negative neurological ROS     GI/Hepatic negative GI ROS, Neg liver ROS,   Endo/Other  Hyperthyroidism   Renal/GU negative Renal ROS  negative genitourinary   Musculoskeletal   Abdominal   Peds  Hematology negative hematology ROS (+)   Anesthesia Other Findings   Reproductive/Obstetrics negative OB ROS                             Anesthesia Physical Anesthesia Plan  ASA: 2  Anesthesia Plan: Spinal   Post-op Pain Management:  Regional for Post-op pain   Induction:   PONV Risk Score and Plan: Propofol infusion  Airway Management Planned:   Additional Equipment:   Intra-op Plan:   Post-operative Plan:   Informed Consent: I have reviewed the patients History and Physical, chart, labs and discussed the procedure including the risks, benefits and alternatives for the proposed anesthesia with the patient or authorized representative who has indicated his/her understanding and acceptance.     Dental Advisory Given  Plan Discussed with: CRNA  Anesthesia Plan Comments:         Anesthesia Quick Evaluation

## 2021-01-30 ENCOUNTER — Encounter (HOSPITAL_COMMUNITY): Payer: Self-pay | Admitting: Orthopedic Surgery

## 2021-01-30 DIAGNOSIS — E039 Hypothyroidism, unspecified: Secondary | ICD-10-CM | POA: Diagnosis not present

## 2021-01-30 DIAGNOSIS — M1711 Unilateral primary osteoarthritis, right knee: Secondary | ICD-10-CM | POA: Diagnosis not present

## 2021-01-30 DIAGNOSIS — Z79899 Other long term (current) drug therapy: Secondary | ICD-10-CM | POA: Diagnosis not present

## 2021-01-30 DIAGNOSIS — M4186 Other forms of scoliosis, lumbar region: Secondary | ICD-10-CM | POA: Diagnosis not present

## 2021-01-30 DIAGNOSIS — Z7982 Long term (current) use of aspirin: Secondary | ICD-10-CM | POA: Diagnosis not present

## 2021-01-30 DIAGNOSIS — M545 Low back pain, unspecified: Secondary | ICD-10-CM | POA: Diagnosis not present

## 2021-01-30 DIAGNOSIS — G8929 Other chronic pain: Secondary | ICD-10-CM | POA: Diagnosis not present

## 2021-01-30 DIAGNOSIS — Z87891 Personal history of nicotine dependence: Secondary | ICD-10-CM | POA: Diagnosis not present

## 2021-01-30 DIAGNOSIS — I10 Essential (primary) hypertension: Secondary | ICD-10-CM | POA: Diagnosis not present

## 2021-01-30 DIAGNOSIS — Z96651 Presence of right artificial knee joint: Secondary | ICD-10-CM | POA: Diagnosis not present

## 2021-01-30 LAB — BASIC METABOLIC PANEL
Anion gap: 6 (ref 5–15)
BUN: 14 mg/dL (ref 8–23)
CO2: 25 mmol/L (ref 22–32)
Calcium: 8.1 mg/dL — ABNORMAL LOW (ref 8.9–10.3)
Chloride: 101 mmol/L (ref 98–111)
Creatinine, Ser: 0.8 mg/dL (ref 0.44–1.00)
GFR, Estimated: >60 mL/min (ref >60)
Glucose, Bld: 113 mg/dL — ABNORMAL HIGH (ref 70–99)
Potassium: 4.3 mmol/L (ref 3.5–5.1)
Sodium: 132 mmol/L — ABNORMAL LOW (ref 135–145)

## 2021-01-30 LAB — CBC
HCT: 30.5 % — ABNORMAL LOW (ref 36.0–46.0)
Hemoglobin: 9.8 g/dL — ABNORMAL LOW (ref 12.0–15.0)
MCH: 29 pg (ref 26.0–34.0)
MCHC: 32.1 g/dL (ref 30.0–36.0)
MCV: 90.2 fL (ref 80.0–100.0)
Platelets: 335 10*3/uL (ref 150–400)
RBC: 3.38 MIL/uL — ABNORMAL LOW (ref 3.87–5.11)
RDW: 12.6 % (ref 11.5–15.5)
WBC: 21.6 10*3/uL — ABNORMAL HIGH (ref 4.0–10.5)
nRBC: 0 % (ref 0.0–0.2)

## 2021-01-30 MED ORDER — HYDROCODONE-ACETAMINOPHEN 7.5-325 MG PO TABS: 1.0000 | ORAL_TABLET | ORAL | 0 refills | Status: DC | PRN

## 2021-01-30 MED ORDER — DOCUSATE SODIUM 100 MG PO CAPS: 100.0000 mg | ORAL_CAPSULE | Freq: Two times a day (BID) | ORAL | 0 refills | Status: AC

## 2021-01-30 MED ORDER — CHLORHEXIDINE GLUCONATE CLOTH 2 % EX PADS
6.0000 | MEDICATED_PAD | Freq: Every day | CUTANEOUS | Status: DC
  Administered 2021-01-30: 6 via TOPICAL

## 2021-01-30 MED ORDER — CELECOXIB 200 MG PO CAPS: 200.0000 mg | ORAL_CAPSULE | Freq: Two times a day (BID) | ORAL | 0 refills | Status: DC

## 2021-01-30 MED ORDER — METHOCARBAMOL 500 MG PO TABS: 500.0000 mg | ORAL_TABLET | Freq: Four times a day (QID) | ORAL | 2 refills | Status: DC | PRN

## 2021-01-30 MED ORDER — ASPIRIN 81 MG PO CHEW: 81.0000 mg | CHEWABLE_TABLET | Freq: Two times a day (BID) | ORAL | 0 refills | Status: DC

## 2021-01-30 MED ORDER — TRAMADOL HCL 50 MG PO TABS: 50.0000 mg | ORAL_TABLET | Freq: Four times a day (QID) | ORAL | 0 refills | Status: DC

## 2021-01-30 NOTE — Discharge Summary (Signed)
  Physician Discharge Summary  Patient ID: Cindy Hopkins MRN: [id number] DOB/AGE: Nov 02, 1940 80 y.o.  Admit date: 01/29/2021 Discharge date: 01/30/2021  Admission Diagnoses: Osteoarthritis right knee  Discharge Diagnoses: Same  Discharged Condition: good  Procedure: Right total knee attune size 3 femur size 3 tibia size 32 patella posterior stabilized fixed-bearing Dr. Mable [place] Course:  Surgery was performed on January 29, 2021 right total knee no complications  Patient tolerated physical therapy well on postop day 1 and was discharged home in good condition       Discharge Exam: BP (!) 167/72   Pulse 69   Temp 97.8 F (36.6 C) (Oral)   Resp 15   SpO2 98%  Physical Exam Neurovascularly intact, awake alert and oriented x3 mood and affect normal dressing dry calf soft and supple   Disposition:    Allergies as of 01/30/2021   No Known Allergies      Medication List     STOP taking these medications    acetaminophen 500 MG tablet Commonly known as: TYLENOL   acetaminophen 650 MG CR tablet Commonly known as: TYLENOL   aspirin 81 MG tablet Replaced by: aspirin 81 MG chewable tablet   meloxicam 15 MG tablet Commonly known as: MOBIC   pantoprazole 40 MG tablet Commonly known as: PROTONIX   potassium chloride SA 20 MEQ tablet Commonly known as: KLOR-CON   zolpidem 10 MG tablet Commonly known as: AMBIEN       TAKE these medications    aspirin 81 MG chewable tablet Chew 1 tablet (81 mg total) by mouth 2 (two) times daily. Replaces: aspirin 81 MG tablet   atenolol 50 MG tablet Commonly known as: TENORMIN Take 50 mg by mouth daily.   atorvastatin 20 MG tablet Commonly known as: LIPITOR Take 20 mg by mouth daily.   CALCIUM 1200 PO Take 1,200 mg by mouth daily.   celecoxib 200 MG capsule Commonly known as: CELEBREX Take 1 capsule (200 mg total) by mouth 2 (two) times daily.   docusate sodium 100 MG capsule Commonly known as:  COLACE Take 1 capsule (100 mg total) by mouth 2 (two) times daily.   escitalopram 20 MG tablet Commonly known as: LEXAPRO Take 20 mg by mouth daily.   HYDROcodone-acetaminophen 7.5-325 MG tablet Commonly known as: NORCO Take 1 tablet by mouth every 4 (four) hours as needed for moderate pain.   lisinopril 20 MG tablet Commonly known as: ZESTRIL Take 20 mg by mouth daily.   methimazole 5 MG tablet Commonly known as: TAPAZOLE Take 5-10 mg by mouth See admin instructions. Take 5 mg by mouth on Monday, Wednesday and Friday on Saturday take 10 mg   methocarbamol 500 MG tablet Commonly known as: ROBAXIN Take 1 tablet (500 mg total) by mouth every 6 (six) hours as needed for muscle spasms.   traMADol 50 MG tablet Commonly known as: ULTRAM Take 1 tablet (50 mg total) by mouth every 6 (six) hours.   Vitamin D 50 MCG (2000 UT) tablet Take 2,000 Units by mouth daily.       ASK your doctor about these medications    fluticasone 50 MCG/ACT nasal spray Commonly known as: FLONASE SPRAY 2 SPRAYS INTO EACH NOSTRIL TWICE DAILY FOR 7 DAYS; THEN USE ONCE DAILY AS NEEDED.         Signed: [name] 01/30/2021, 12:31 PM

## 2021-01-30 NOTE — Progress Notes (Addendum)
Physical Therapy Treatment Patient Details Name: Cindy Hopkins MRN: [id number] DOB: 1940/08/22 Today's Date: 01/30/2021  RIGHT KNEE ROM: 0 - 109  degrees AMBULATION DISTANCE:  200 feet with RW with step through gait pattern with Mod Independent, ascend/descend 3 stairs with proper technique using 1 side rail with Supervision    History of Present Illness Cindy Rabold V.  has presented today for surgery, with the diagnosis of right knee osteoarthritis.  The various methods of treatment have been discussed with the patient and family. After consideration of risks, benefits and other options for treatment, the patient has consented to  Procedure(s):  RIGHT TOTAL KNEE ARTHROPLASTY (Right) as a surgical intervention.  The patient's history has been reviewed, patient examined, no change in status, stable for surgery.  I have reviewed the patient's chart and labs.  Questions were answered to the patient's satisfaction.    PT Comments    Patient presented din bed with ice on R knee and  willing to participate in therapy. Patient tolerated therapeutic exercise including: Quad sets, heel slides, ankle pumps, long arc quads. Patient was modified independent with use of AD during ambulation of 200 feet with a single seated resting break. Patient was able to ascend/descend 3 stairs with proper technique. Patient will benefit from continued physical therapy in hospital and recommended venue below to increase strength, balance, endurance for safe ADLs and gait.   Follow Up Recommendations  Outpatient PT     Equipment Recommendations  None recommended by PT    Recommendations for Other Services       Precautions / Restrictions Precautions Precautions: Fall Restrictions Weight Bearing Restrictions: Yes RLE Weight Bearing: Weight bearing as tolerated    Mobility  Bed Mobility Overal bed mobility: Modified Independent             General bed mobility comments: labored movement with  caution Patient Response: Cooperative  Transfers Overall transfer level: Modified independent Equipment used: Rolling walker (2 wheeled)             General transfer comment: use of RW  Ambulation/Gait Ambulation/Gait assistance: Supervision;Modified independent (Device/Increase time) Gait Distance (Feet): 200 Feet Assistive device: Rolling walker (2 wheeled) Gait Pattern/deviations: Step-to pattern;Decreased step length - left;Decreased stance time - right Gait velocity: decreased   General Gait Details: Slight decreased WB tolerance on RLE due to pain   Stairs Stairs: Yes Stairs assistance: Supervision;Min guard Stair Management: One rail Right;Step to pattern Number of Stairs: 3 General stair comments: 3 ascending stairs leading with the Left leg, 3 decending stairs leading with the right leg, right arm on railing, no loss of balance   Wheelchair Mobility    Modified Rankin (Stroke Patients Only)       Balance Overall balance assessment: Needs assistance Sitting-balance support: Feet supported Sitting balance-Leahy Scale: Normal Sitting balance - Comments: at EOB   Standing balance support: Bilateral upper extremity supported;During functional activity Standing balance-Leahy Scale: Good Standing balance comment: good/fair due to pain, use of RW                            Cognition Arousal/Alertness: Awake/alert Behavior During Therapy: WFL for tasks assessed/performed Overall Cognitive Status: Within Functional Limits for tasks assessed                                        Exercises  Total Joint Exercises Ankle Circles/Pumps: Supine;AROM;Strengthening;Both;20 reps Quad Sets: AROM;Strengthening;Supine;Right;20 reps Heel Slides: Supine;AROM;Right;20 reps Long Arc Quad: Seated;AROM;Strengthening;Right;10 reps Knee Flexion: AAROM;Seated;Right;10 reps Goniometric ROM: 0 - 109 degrees    General Comments        Pertinent  Vitals/Pain Pain Assessment: Faces Faces Pain Scale: Hurts little more Pain Location: Right knee/quad Pain Descriptors / Indicators: Aching;Sore Pain Intervention(s): Monitored during session;Premedicated before session;Ice applied    Home Living                      Prior Function            PT Goals (current goals can now be found in the care plan section) Acute Rehab PT Goals Patient Stated Goal: return home PT Goal Formulation: With patient Time For Goal Achievement: 02/12/21 Potential to Achieve Goals: Good Progress towards PT goals: Progressing toward goals    Frequency    BID      PT Plan Current plan remains appropriate    Co-evaluation              AM-PAC PT "6 Clicks" Mobility   Outcome Measure  Help needed turning from your back to your side while in a flat bed without using bedrails?: None Help needed moving from lying on your back to sitting on the side of a flat bed without using bedrails?: None Help needed moving to and from a bed to a chair (including a wheelchair)?: A Little Help needed standing up from a chair using your arms (e.g., wheelchair or bedside chair)?: A Little Help needed to walk in hospital room?: A Little Help needed climbing 3-5 steps with a railing? : A Little 6 Click Score: 20    End of Session   Activity Tolerance: Patient tolerated treatment well;Patient limited by fatigue Patient left: in bed;with call bell/phone within reach Nurse Communication: Mobility status PT Visit Diagnosis: Unsteadiness on feet (R26.81);Other abnormalities of gait and mobility (R26.89);Muscle weakness (generalized) (M62.81)     Time: 0981-1914 PT Time Calculation (min) (ACUTE ONLY): 27 min  Charges:  $Gait Training: 8-22 mins $Therapeutic Exercise: 8-22 mins                     11:09 AM, 01/30/21 [name] SPT  11:09 AM, 01/30/21 [name], MPT Physical Therapist with [facility] [phone]  office [phone] mobile phone

## 2021-01-30 NOTE — Care Management Obs Status (Addendum)
[place] NOTIFICATION   Patient Details  Name: Cindy Hopkins MRN: [id number] Date of Birth: Jun 30, 1941   Medicare Observation Status Notification Given:  Yes  Tommy Medal 01/30/2021, 1:38 PM

## 2021-01-30 NOTE — Progress Notes (Signed)
   Subjective:    Patient ID: Cindy Life., female    DOB: 13-Jul-1941, 80 y.o.   MRN: [id number]  Postop day 1 status post right total knee  Postop x-ray was normal  Patient is comfortable   Review of Systems     Objective:   Physical Exam  Neurovascular exam is intact  Mentation is normal      Assessment & Plan:   Stable after total knee  PT today if goes well can patient can go home

## 2021-01-31 DIAGNOSIS — N3941 Urge incontinence: Secondary | ICD-10-CM | POA: Diagnosis not present

## 2021-01-31 DIAGNOSIS — I10 Essential (primary) hypertension: Secondary | ICD-10-CM | POA: Diagnosis not present

## 2021-01-31 DIAGNOSIS — N816 Rectocele: Secondary | ICD-10-CM | POA: Diagnosis not present

## 2021-01-31 DIAGNOSIS — F32A Depression, unspecified: Secondary | ICD-10-CM | POA: Diagnosis not present

## 2021-01-31 DIAGNOSIS — M419 Scoliosis, unspecified: Secondary | ICD-10-CM | POA: Diagnosis not present

## 2021-01-31 DIAGNOSIS — M5136 Other intervertebral disc degeneration, lumbar region: Secondary | ICD-10-CM | POA: Diagnosis not present

## 2021-01-31 DIAGNOSIS — N952 Postmenopausal atrophic vaginitis: Secondary | ICD-10-CM | POA: Diagnosis not present

## 2021-01-31 DIAGNOSIS — Z471 Aftercare following joint replacement surgery: Secondary | ICD-10-CM | POA: Diagnosis not present

## 2021-01-31 DIAGNOSIS — F419 Anxiety disorder, unspecified: Secondary | ICD-10-CM | POA: Diagnosis not present

## 2021-02-01 ENCOUNTER — Telehealth: Payer: Self-pay | Admitting: Radiology

## 2021-02-01 DIAGNOSIS — N816 Rectocele: Secondary | ICD-10-CM | POA: Diagnosis not present

## 2021-02-01 DIAGNOSIS — N3941 Urge incontinence: Secondary | ICD-10-CM | POA: Diagnosis not present

## 2021-02-01 DIAGNOSIS — I10 Essential (primary) hypertension: Secondary | ICD-10-CM | POA: Diagnosis not present

## 2021-02-01 DIAGNOSIS — N952 Postmenopausal atrophic vaginitis: Secondary | ICD-10-CM | POA: Diagnosis not present

## 2021-02-01 DIAGNOSIS — M419 Scoliosis, unspecified: Secondary | ICD-10-CM | POA: Diagnosis not present

## 2021-02-01 DIAGNOSIS — Z471 Aftercare following joint replacement surgery: Secondary | ICD-10-CM | POA: Diagnosis not present

## 2021-02-01 DIAGNOSIS — M5136 Other intervertebral disc degeneration, lumbar region: Secondary | ICD-10-CM | POA: Diagnosis not present

## 2021-02-01 DIAGNOSIS — F32A Depression, unspecified: Secondary | ICD-10-CM | POA: Diagnosis not present

## 2021-02-01 DIAGNOSIS — F419 Anxiety disorder, unspecified: Secondary | ICD-10-CM | POA: Diagnosis not present

## 2021-02-01 NOTE — Telephone Encounter (Signed)
Patient has taken off stockings at night, and has gone to put them on this morning and is concerned about bandage, she can see dark drainage. I assured her the bandage is designed to stay on for 2 weeks, and is is very absorbant and we need her to make sure she puts hose on, she states she feels like if she puts hose on it will "squeeze out " more drainage on bandage. I told her again to go ahead and put on the hose and the bandage will do what it is supposed to, and I will send you the message also

## 2021-02-04 ENCOUNTER — Encounter: Payer: Self-pay | Admitting: Orthopedic Surgery

## 2021-02-04 ENCOUNTER — Ambulatory Visit (INDEPENDENT_AMBULATORY_CARE_PROVIDER_SITE_OTHER): Payer: Medicare HMO | Admitting: Orthopedic Surgery

## 2021-02-04 ENCOUNTER — Other Ambulatory Visit: Payer: Self-pay

## 2021-02-04 DIAGNOSIS — M419 Scoliosis, unspecified: Secondary | ICD-10-CM | POA: Diagnosis not present

## 2021-02-04 DIAGNOSIS — N816 Rectocele: Secondary | ICD-10-CM | POA: Diagnosis not present

## 2021-02-04 DIAGNOSIS — M5136 Other intervertebral disc degeneration, lumbar region: Secondary | ICD-10-CM | POA: Diagnosis not present

## 2021-02-04 DIAGNOSIS — I10 Essential (primary) hypertension: Secondary | ICD-10-CM | POA: Diagnosis not present

## 2021-02-04 DIAGNOSIS — F32A Depression, unspecified: Secondary | ICD-10-CM | POA: Diagnosis not present

## 2021-02-04 DIAGNOSIS — Z471 Aftercare following joint replacement surgery: Secondary | ICD-10-CM | POA: Diagnosis not present

## 2021-02-04 DIAGNOSIS — Z09 Encounter for follow-up examination after completed treatment for conditions other than malignant neoplasm: Secondary | ICD-10-CM

## 2021-02-04 DIAGNOSIS — N3941 Urge incontinence: Secondary | ICD-10-CM | POA: Diagnosis not present

## 2021-02-04 DIAGNOSIS — N952 Postmenopausal atrophic vaginitis: Secondary | ICD-10-CM | POA: Diagnosis not present

## 2021-02-04 DIAGNOSIS — F419 Anxiety disorder, unspecified: Secondary | ICD-10-CM | POA: Diagnosis not present

## 2021-02-04 NOTE — Patient Instructions (Signed)
Appt June 22 dressing change only do not remove staples

## 2021-02-04 NOTE — Progress Notes (Signed)
Chief Complaint  Patient presents with   Post-op Follow-up    Right knee replacement. Large amount of bleeding/ drainage 01/29/21    Unscheduled postop visit patient had a large amount of drainage we told her to come and let us check it the bandage was soiled she has some serous drainage from the middle of the wound but her incision looks great  We put a pressure dressing on I will see her on Wednesday do not take staples out

## 2021-02-06 ENCOUNTER — Ambulatory Visit (INDEPENDENT_AMBULATORY_CARE_PROVIDER_SITE_OTHER): Payer: Medicare HMO | Admitting: Orthopedic Surgery

## 2021-02-06 ENCOUNTER — Other Ambulatory Visit: Payer: Self-pay

## 2021-02-06 DIAGNOSIS — N816 Rectocele: Secondary | ICD-10-CM | POA: Diagnosis not present

## 2021-02-06 DIAGNOSIS — Z471 Aftercare following joint replacement surgery: Secondary | ICD-10-CM | POA: Diagnosis not present

## 2021-02-06 DIAGNOSIS — M5136 Other intervertebral disc degeneration, lumbar region: Secondary | ICD-10-CM | POA: Diagnosis not present

## 2021-02-06 DIAGNOSIS — Z96651 Presence of right artificial knee joint: Secondary | ICD-10-CM

## 2021-02-06 DIAGNOSIS — N3941 Urge incontinence: Secondary | ICD-10-CM | POA: Diagnosis not present

## 2021-02-06 DIAGNOSIS — F32A Depression, unspecified: Secondary | ICD-10-CM | POA: Diagnosis not present

## 2021-02-06 DIAGNOSIS — N952 Postmenopausal atrophic vaginitis: Secondary | ICD-10-CM | POA: Diagnosis not present

## 2021-02-06 DIAGNOSIS — Z09 Encounter for follow-up examination after completed treatment for conditions other than malignant neoplasm: Secondary | ICD-10-CM

## 2021-02-06 DIAGNOSIS — M1710 Unilateral primary osteoarthritis, unspecified knee: Secondary | ICD-10-CM

## 2021-02-06 DIAGNOSIS — I10 Essential (primary) hypertension: Secondary | ICD-10-CM | POA: Diagnosis not present

## 2021-02-06 DIAGNOSIS — F419 Anxiety disorder, unspecified: Secondary | ICD-10-CM | POA: Diagnosis not present

## 2021-02-06 DIAGNOSIS — M419 Scoliosis, unspecified: Secondary | ICD-10-CM | POA: Diagnosis not present

## 2021-02-06 DIAGNOSIS — M1711 Unilateral primary osteoarthritis, right knee: Secondary | ICD-10-CM

## 2021-02-06 NOTE — Progress Notes (Signed)
Encounter Diagnoses  Name Primary?   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    Postop week #1 serous drainage no erythema looks pretty good come back in 1 week to get staples out

## 2021-02-08 DIAGNOSIS — N816 Rectocele: Secondary | ICD-10-CM | POA: Diagnosis not present

## 2021-02-08 DIAGNOSIS — M5136 Other intervertebral disc degeneration, lumbar region: Secondary | ICD-10-CM | POA: Diagnosis not present

## 2021-02-08 DIAGNOSIS — M419 Scoliosis, unspecified: Secondary | ICD-10-CM | POA: Diagnosis not present

## 2021-02-08 DIAGNOSIS — F32A Depression, unspecified: Secondary | ICD-10-CM | POA: Diagnosis not present

## 2021-02-08 DIAGNOSIS — N952 Postmenopausal atrophic vaginitis: Secondary | ICD-10-CM | POA: Diagnosis not present

## 2021-02-08 DIAGNOSIS — N3941 Urge incontinence: Secondary | ICD-10-CM | POA: Diagnosis not present

## 2021-02-08 DIAGNOSIS — Z471 Aftercare following joint replacement surgery: Secondary | ICD-10-CM | POA: Diagnosis not present

## 2021-02-08 DIAGNOSIS — F419 Anxiety disorder, unspecified: Secondary | ICD-10-CM | POA: Diagnosis not present

## 2021-02-08 DIAGNOSIS — I10 Essential (primary) hypertension: Secondary | ICD-10-CM | POA: Diagnosis not present

## 2021-02-12 DIAGNOSIS — M25561 Pain in right knee: Secondary | ICD-10-CM | POA: Diagnosis not present

## 2021-02-13 ENCOUNTER — Ambulatory Visit (INDEPENDENT_AMBULATORY_CARE_PROVIDER_SITE_OTHER): Payer: Medicare HMO | Admitting: Orthopedic Surgery

## 2021-02-13 ENCOUNTER — Other Ambulatory Visit: Payer: Self-pay

## 2021-02-13 ENCOUNTER — Encounter: Payer: Self-pay | Admitting: Orthopedic Surgery

## 2021-02-13 DIAGNOSIS — Z96651 Presence of right artificial knee joint: Secondary | ICD-10-CM

## 2021-02-13 DIAGNOSIS — G8918 Other acute postprocedural pain: Secondary | ICD-10-CM

## 2021-02-13 MED ORDER — TRAMADOL HCL 50 MG PO TABS: 50.0000 mg | ORAL_TABLET | Freq: Four times a day (QID) | ORAL | 0 refills | Status: AC | PRN

## 2021-02-13 NOTE — Patient Instructions (Addendum)
Change dressing daily  Continue therapeutic exercises  Ice as needed  Take tramadol for pain if Tylenol not working  [place] to drive short distances

## 2021-02-13 NOTE — Progress Notes (Signed)
POST OP   Chief Complaint  Patient presents with   Post-op Follow-up    June 14th 2022    Encounter Diagnoses  Name Primary?   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  We found an open area at mid incision about 1.5 cm long  There is no erythema  Her range of motion is 0-110  Steri-Strip the area is back together after cleaning and then put on a new dressing which she can change daily  She will see me in a week to check the dressing and the wound  No antibiotics at this time is no signs of infection  Recommend tramadol for pain prescription written  Patient to drive short distances

## 2021-02-14 DIAGNOSIS — I10 Essential (primary) hypertension: Secondary | ICD-10-CM | POA: Diagnosis not present

## 2021-02-14 DIAGNOSIS — E1165 Type 2 diabetes mellitus with hyperglycemia: Secondary | ICD-10-CM | POA: Diagnosis not present

## 2021-02-20 ENCOUNTER — Ambulatory Visit (INDEPENDENT_AMBULATORY_CARE_PROVIDER_SITE_OTHER): Payer: Medicare HMO | Admitting: Orthopedic Surgery

## 2021-02-20 ENCOUNTER — Other Ambulatory Visit: Payer: Self-pay

## 2021-02-20 DIAGNOSIS — Z96651 Presence of right artificial knee joint: Secondary | ICD-10-CM

## 2021-02-20 DIAGNOSIS — T8131XD Disruption of external operation (surgical) wound, not elsewhere classified, subsequent encounter: Secondary | ICD-10-CM

## 2021-02-20 NOTE — Progress Notes (Signed)
POST OP   Chief Complaint  Patient presents with   Routine Post Op    Right knee/pt states she is still having some pain right at the knee pain    Encounter Diagnoses  Name Primary?   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

## 2021-02-21 DIAGNOSIS — T8131XD Disruption of external operation (surgical) wound, not elsewhere classified, subsequent encounter: Secondary | ICD-10-CM

## 2021-02-21 DIAGNOSIS — Z96651 Presence of right artificial knee joint: Secondary | ICD-10-CM

## 2021-02-21 NOTE — H&P (Signed)
Outpatient admission for minor surgery history and physical  Chief complaint drainage right knee status post right total knee on January 29, 2021  History 80 year old female underwent uncomplicated right total knee replacement on January 29, 2021, 2 weeks postop at the time of staple removal a 1-1/2 cm portion of the distal half of the wound opened up  This was treated with dressing changes but did not close.  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  There were no vitals taken for this visit.  General appearance normal development grooming and hygiene Alert oriented x3 Mood pleasant affect normal  Gait slight limp uses a cane normal heel-to-toe otherwise  Right knee small 1 and half centimeter area of opening no surrounding erythema drainage clear knee range of motion 0-100 knee stability normal strength normal skin as stated pulses good lymph nodes negative sensation intact no pathologic reflexes coordination balance normal  Impression  Right knee wound dehiscence  Plan wound closure right knee

## 2021-02-22 ENCOUNTER — Encounter (HOSPITAL_COMMUNITY)
Admission: RE | Disposition: A | Discharge status: Home / Self Care | Payer: Self-pay | Source: Home / Self Care | Attending: Orthopedic Surgery

## 2021-02-22 ENCOUNTER — Ambulatory Visit (HOSPITAL_COMMUNITY)
Admission: RE | Admit: 2021-02-22 | Discharge: 2021-02-22 | Disposition: A | Discharge status: Home / Self Care | Payer: Medicare HMO | Attending: Orthopedic Surgery | Admitting: Orthopedic Surgery

## 2021-02-22 ENCOUNTER — Encounter (HOSPITAL_COMMUNITY): Payer: Self-pay | Admitting: Orthopedic Surgery

## 2021-02-22 ENCOUNTER — Other Ambulatory Visit: Payer: Self-pay

## 2021-02-22 DIAGNOSIS — T8131XA Disruption of external operation (surgical) wound, not elsewhere classified, initial encounter: Secondary | ICD-10-CM | POA: Insufficient documentation

## 2021-02-22 DIAGNOSIS — Z96651 Presence of right artificial knee joint: Secondary | ICD-10-CM | POA: Diagnosis not present

## 2021-02-22 DIAGNOSIS — Y838 Other surgical procedures as the cause of abnormal reaction of the patient, or of later complication, without mention of misadventure at the time of the procedure: Secondary | ICD-10-CM | POA: Insufficient documentation

## 2021-02-22 DIAGNOSIS — T8131XD Disruption of external operation (surgical) wound, not elsewhere classified, subsequent encounter: Secondary | ICD-10-CM

## 2021-02-22 HISTORY — PX: SECONDARY CLOSURE OF WOUND: SHX6208

## 2021-02-22 SURGERY — SECONDARY CLOSURE OF WOUND
Anesthesia: LOCAL | Site: Knee | Laterality: Right

## 2021-02-22 MED ORDER — SULFAMETHOXAZOLE-TRIMETHOPRIM 800-160 MG PO TABS: 1.0000 | ORAL_TABLET | Freq: Two times a day (BID) | ORAL | 0 refills | Status: DC

## 2021-02-22 MED ORDER — LIDOCAINE HCL (PF) 1 % IJ SOLN
INTRAMUSCULAR | Status: AC
  Filled 2021-02-22: qty 30

## 2021-02-22 MED ORDER — LIDOCAINE HCL (PF) 1 % IJ SOLN
INTRAMUSCULAR | Status: DC | PRN
Start: 2021-02-22 — End: 2021-02-22
  Administered 2021-02-22: 5 mL
  Administered 2021-02-22: 3 mL

## 2021-02-22 MED ORDER — CEFAZOLIN SODIUM-DEXTROSE 2-4 GM/100ML-% IV SOLN: 2.0000 g | INTRAVENOUS | Status: DC

## 2021-02-22 SURGICAL SUPPLY — 7 items
APL PRP STRL LF ISPRP CHG 10.5 (MISCELLANEOUS) ×1
APPLICATOR CHLORAPREP 10.5 ORG (MISCELLANEOUS) ×2 IMPLANT
MARKER SKIN DUAL TIP RULER LAB (MISCELLANEOUS) ×2 IMPLANT
SUT ETHILON 3 0 FSL (SUTURE) ×2 IMPLANT
SUT MON AB 2-0 CT1 36 (SUTURE) ×2 IMPLANT
SYR CONTROL 10ML LL (SYRINGE) ×2 IMPLANT
TOWEL NATURAL 4PK STERILE (DISPOSABLE) ×2 IMPLANT

## 2021-02-22 NOTE — Progress Notes (Signed)
Per Dr Aline Brochure, no ancef needed for this procedure. Pt placed on home antibiotics.

## 2021-02-22 NOTE — Interval H&P Note (Signed)
History and Physical Interval Note:  02/22/2021 9:37 AM  Cindy Revels V.  has presented today for surgery, with the diagnosis of open wound incision right knee.  The various methods of treatment have been discussed with the patient and family. After consideration of risks, benefits and other options for treatment, the patient has consented to  Procedure(s): MINOR SECONDARY CLOSURE OF WOUND (Right) as a surgical intervention.  The patient's history has been reviewed, patient examined, no change in status, stable for surgery.  I have reviewed the patient's chart and labs.  Questions were answered to the patient's satisfaction.     [name]

## 2021-02-22 NOTE — Brief Op Note (Signed)
02/22/2021  10:05 AM  PATIENT:  Cindy Hopkins  80 y.o. female  PRE-OPERATIVE DIAGNOSIS:  open wound incision right knee  POST-OPERATIVE DIAGNOSIS: Surgical wound dehiscence right knee status post right total knee, superficial  PROCEDURE:  Procedure(s): MINOR SECONDARY CLOSURE OF WOUND (Right)  80 year old female had a right total knee at 2 weeks of the mid to distal portion of the wound opened up about 1-1/2 cm.  We tried dressing changes it did not close she showed no signs of infection and presented for wound closure  Procedure was done in the following manner.  The patient was seen site confirmed and marked and timeout was performed  The area was prepped with ChloraPrep cultured and draped sterilely timeout was completed  The incision was injected with 1% lidocaine.  After 5 minutes a second injection was done to assure adequate anesthesia.  The wound was freshened up with a 15 blade and then 3-0 nylon sutures in interrupted fashion were used to close the skin.  Sterile dressing was applied along with an Ace bandage  Patient was allowed to go home weight-bear as tolerated and activity as tolerated  SURGEON:  Surgeon(s) and Role:    * Carole Civil, MD - Primary  PHYSICIAN ASSISTANT:   ASSISTANTS: none   ANESTHESIA:   local  EBL:  min   BLOOD ADMINISTERED:none  DRAINS: none   LOCAL MEDICATIONS USED:  XYLOCAINE   SPECIMEN:  No Specimen  DISPOSITION OF SPECIMEN:  N/A  COUNTS:  YES  TOURNIQUET:  * No tourniquets in log *  DICTATION: .Dragon Dictation  PLAN OF CARE: Discharge to home after PACU  PATIENT DISPOSITION:  PACU - hemodynamically stable.   Delay start of Pharmacological VTE agent (>24hrs) due to surgical blood loss or risk of bleeding: not applicable

## 2021-02-22 NOTE — Op Note (Signed)
02/22/2021  10:05 AM  PATIENT:  Cindy Hopkins  80 y.o. female  PRE-OPERATIVE DIAGNOSIS:  open wound incision right knee  POST-OPERATIVE DIAGNOSIS: Surgical wound dehiscence right knee status post right total knee, superficial  PROCEDURE:  Procedure(s): MINOR SECONDARY CLOSURE OF WOUND (Right)  80 year old female had a right total knee at 2 weeks of the mid to distal portion of the wound opened up about 1-1/2 cm.  We tried dressing changes it did not close she showed no signs of infection and presented for wound closure  Procedure was done in the following manner.  The patient was seen site confirmed and marked and timeout was performed  The area was prepped with ChloraPrep cultured and draped sterilely timeout was completed  The incision was injected with 1% lidocaine.  After 5 minutes a second injection was done to assure adequate anesthesia.  The wound was freshened up with a 15 blade and then 3-0 nylon sutures in interrupted fashion were used to close the skin.  Sterile dressing was applied along with an Ace bandage  Patient was allowed to go home weight-bear as tolerated and activity as tolerated  SURGEON:  Surgeon(s) and Role:    * Carole Civil, MD - Primary  PHYSICIAN ASSISTANT:   ASSISTANTS: none   ANESTHESIA:   local  EBL:  min   BLOOD ADMINISTERED:none  DRAINS: none   LOCAL MEDICATIONS USED:  XYLOCAINE   SPECIMEN:  No Specimen  DISPOSITION OF SPECIMEN:  N/A  COUNTS:  YES  TOURNIQUET:  * No tourniquets in log *  DICTATION: .Dragon Dictation  PLAN OF CARE: Discharge to home after PACU  PATIENT DISPOSITION:  PACU - hemodynamically stable.   Delay start of Pharmacological VTE agent (>24hrs) due to surgical blood loss or risk of bleeding: not applicable

## 2021-02-25 ENCOUNTER — Ambulatory Visit (INDEPENDENT_AMBULATORY_CARE_PROVIDER_SITE_OTHER): Payer: Medicare HMO | Admitting: Orthopedic Surgery

## 2021-02-25 ENCOUNTER — Other Ambulatory Visit: Payer: Self-pay

## 2021-02-25 ENCOUNTER — Encounter (HOSPITAL_COMMUNITY): Payer: Self-pay | Admitting: Orthopedic Surgery

## 2021-02-25 DIAGNOSIS — T8131XD Disruption of external operation (surgical) wound, not elsewhere classified, subsequent encounter: Secondary | ICD-10-CM

## 2021-02-25 DIAGNOSIS — Z96651 Presence of right artificial knee joint: Secondary | ICD-10-CM

## 2021-02-25 LAB — AEROBIC CULTURE W GRAM STAIN (SUPERFICIAL SPECIMEN)
Culture: NO GROWTH
Gram Stain: NONE SEEN
Special Requests: NORMAL

## 2021-02-25 NOTE — Progress Notes (Signed)
POST OP /  FOLLOW UP   Encounter Diagnoses  Name Primary?   Postoperative wound dehiscence, subsequent encounter secondary wound closure 02/22/21  Yes   S/P TKR (total knee replacement), right January 29 2021      Chief Complaint  Patient presents with   Routine Post Op    Dressing change only     Status post wound closure after irrigation and cleaning in the short stay OR minor room  No drainage  Wound looks good.  Clear dressing placed  Patient will continue Bactrim  Cultures came back mixed flora most likely contaminant  Return 1 week wound check

## 2021-02-28 ENCOUNTER — Telehealth: Payer: Self-pay | Admitting: Orthopedic Surgery

## 2021-02-28 NOTE — Telephone Encounter (Signed)
I called her discussed, she voiced understanding  States drainage is from bottom of the stitches, is clear redness is almost gone, has improved with the antibiotic use.

## 2021-02-28 NOTE — Telephone Encounter (Signed)
Patient called and wants to speak with Amy.   She wants to talk with you abouit her stitches and incision.  It is seeping and scrunch up.       She has put gauge and wrapped it with tape to hold it on and it is still seeping yellow fluid.

## 2021-03-04 ENCOUNTER — Ambulatory Visit (INDEPENDENT_AMBULATORY_CARE_PROVIDER_SITE_OTHER): Payer: Medicare HMO | Admitting: Orthopedic Surgery

## 2021-03-04 ENCOUNTER — Other Ambulatory Visit: Payer: Self-pay

## 2021-03-04 DIAGNOSIS — T8131XD Disruption of external operation (surgical) wound, not elsewhere classified, subsequent encounter: Secondary | ICD-10-CM

## 2021-03-04 MED ORDER — SULFAMETHOXAZOLE-TRIMETHOPRIM 800-160 MG PO TABS: 1.0000 | ORAL_TABLET | Freq: Two times a day (BID) | ORAL | 0 refills | Status: AC

## 2021-03-04 NOTE — Progress Notes (Signed)
POST OP   Chief Complaint  Patient presents with   Routine Post Op    Post op/wound ck-dressing change    Encounter Diagnosis  Name Primary?   Postoperative wound dehiscence, subsequent encounter secondary wound closure 02/22/21  Yes    PROCEDURE: rt tka [id number] and minor wound closure 02/22/21  POV # 6/2  Meds related: Bactrim//  Picture: wound   Drainage is decreasing  The wound is still not closing well although the drainage is decreasing there is no surrounding erythema no increase in pain  I recommend she hold on therapy until we get the wound closed  Follow-up in a week continue Bactrim   Meds ordered this encounter  Medications   sulfamethoxazole-trimethoprim (BACTRIM DS) 800-160 MG tablet    Sig: Take 1 tablet by mouth 2 (two) times daily for 10 days.    Dispense:  20 tablet    Refill:  0

## 2021-03-11 ENCOUNTER — Other Ambulatory Visit: Payer: Self-pay

## 2021-03-11 ENCOUNTER — Ambulatory Visit (INDEPENDENT_AMBULATORY_CARE_PROVIDER_SITE_OTHER): Payer: Medicare HMO | Admitting: Orthopedic Surgery

## 2021-03-11 ENCOUNTER — Encounter: Payer: Self-pay | Admitting: Orthopedic Surgery

## 2021-03-11 DIAGNOSIS — Z96651 Presence of right artificial knee joint: Secondary | ICD-10-CM

## 2021-03-11 DIAGNOSIS — M545 Low back pain, unspecified: Secondary | ICD-10-CM | POA: Diagnosis not present

## 2021-03-11 DIAGNOSIS — G8929 Other chronic pain: Secondary | ICD-10-CM

## 2021-03-11 MED ORDER — TRAMADOL HCL 50 MG PO TABS: 50.0000 mg | ORAL_TABLET | Freq: Four times a day (QID) | ORAL | 0 refills | Status: AC | PRN

## 2021-03-11 NOTE — Patient Instructions (Signed)
For the back pain; Ibuprofen 400 mg (over the counter) 3 times a day as needed  Heat 20 min 3 times a day as needed  Tramadol 50 mg every 6 hrs as needed   For the knee; Shower DO NOT SCRUB the wound Pat dry  New band aid   Resume outpatient therapy

## 2021-03-11 NOTE — Progress Notes (Signed)
POST OP / FOLLOW UP   Encounter Diagnosis  Name Primary?   Chronic low back pain, unspecified back pain laterality, unspecified whether sciatica present Yes     Chief Complaint  Patient presents with   Post-op Follow-up    Wound closure 02/22/21 right total knee 01/29/21 improving still has some yellow drainage     80 year old female status post right total knee June 14 drainage is starting to decrease she still on Bactrim  Sutures were extracted.  Small 1 to 2 mm opening noted about 2 cm long  Recommend resume therapy  She is having some issues with her lower back with continued pain worse over the last week or so  Recommend ibuprofen heat Robaxin  For the incision she can get in the shower pat it dry place Neosporin and a Band-Aid  Follow-up in 2 weeks

## 2021-03-12 ENCOUNTER — Telehealth: Payer: Self-pay | Admitting: Radiology

## 2021-03-12 NOTE — Telephone Encounter (Signed)
I called [facility] I faxed Physical therapy orders there in error, attached incorrect File. I have asked them to shred the incorrect paperwork, they state they will.

## 2021-03-13 DIAGNOSIS — I10 Essential (primary) hypertension: Secondary | ICD-10-CM | POA: Diagnosis not present

## 2021-03-13 DIAGNOSIS — E876 Hypokalemia: Secondary | ICD-10-CM | POA: Diagnosis not present

## 2021-03-13 DIAGNOSIS — R55 Syncope and collapse: Secondary | ICD-10-CM | POA: Diagnosis not present

## 2021-03-13 DIAGNOSIS — E0590 Thyrotoxicosis, unspecified without thyrotoxic crisis or storm: Secondary | ICD-10-CM | POA: Diagnosis not present

## 2021-03-13 DIAGNOSIS — Z96651 Presence of right artificial knee joint: Secondary | ICD-10-CM | POA: Diagnosis not present

## 2021-03-13 DIAGNOSIS — E871 Hypo-osmolality and hyponatremia: Secondary | ICD-10-CM | POA: Diagnosis not present

## 2021-03-13 DIAGNOSIS — M545 Low back pain, unspecified: Secondary | ICD-10-CM | POA: Diagnosis not present

## 2021-03-15 ENCOUNTER — Telehealth: Payer: Self-pay

## 2021-03-15 DIAGNOSIS — Z96651 Presence of right artificial knee joint: Secondary | ICD-10-CM

## 2021-03-15 NOTE — Telephone Encounter (Signed)
Therapist called asking for a renewal of her PT order for her recent knee replacements. Order placed and faxed.

## 2021-03-18 DIAGNOSIS — M25561 Pain in right knee: Secondary | ICD-10-CM | POA: Diagnosis not present

## 2021-03-25 ENCOUNTER — Encounter: Payer: Self-pay | Admitting: Orthopedic Surgery

## 2021-03-25 ENCOUNTER — Other Ambulatory Visit: Payer: Self-pay

## 2021-03-25 ENCOUNTER — Ambulatory Visit (INDEPENDENT_AMBULATORY_CARE_PROVIDER_SITE_OTHER): Payer: Medicare HMO | Admitting: Orthopedic Surgery

## 2021-03-25 DIAGNOSIS — T8131XD Disruption of external operation (surgical) wound, not elsewhere classified, subsequent encounter: Secondary | ICD-10-CM

## 2021-03-25 DIAGNOSIS — Z96651 Presence of right artificial knee joint: Secondary | ICD-10-CM

## 2021-03-25 DIAGNOSIS — M25561 Pain in right knee: Secondary | ICD-10-CM | POA: Diagnosis not present

## 2021-03-25 NOTE — Progress Notes (Signed)
Chief Complaint  Patient presents with   Post-op Follow-up    Right knee DOS 01/29/21   Encounter Diagnoses  Name Primary?   S/P TKR (total knee replacement), right Yes   Postoperative wound dehiscence, subsequent encounter secondary wound closure 02/22/21       80 year old female status post right total knee went back for minor procedure to close the wound wound is finally closed no drainage patient has greater than 90 degrees of flexion full extension walking without a limp however her back is hurting we have already done x-rays she has had anti-inflammatories she is doing exercises at home  We will address this on her next visit in 4 weeks

## 2021-03-28 DIAGNOSIS — M25561 Pain in right knee: Secondary | ICD-10-CM | POA: Diagnosis not present

## 2021-04-01 DIAGNOSIS — M25561 Pain in right knee: Secondary | ICD-10-CM | POA: Diagnosis not present

## 2021-04-04 DIAGNOSIS — M25561 Pain in right knee: Secondary | ICD-10-CM | POA: Diagnosis not present

## 2021-04-08 DIAGNOSIS — M25561 Pain in right knee: Secondary | ICD-10-CM | POA: Diagnosis not present

## 2021-04-15 DIAGNOSIS — E0590 Thyrotoxicosis, unspecified without thyrotoxic crisis or storm: Secondary | ICD-10-CM | POA: Diagnosis not present

## 2021-04-15 DIAGNOSIS — I10 Essential (primary) hypertension: Secondary | ICD-10-CM | POA: Diagnosis not present

## 2021-04-17 DIAGNOSIS — I10 Essential (primary) hypertension: Secondary | ICD-10-CM | POA: Diagnosis not present

## 2021-04-17 DIAGNOSIS — E1165 Type 2 diabetes mellitus with hyperglycemia: Secondary | ICD-10-CM | POA: Diagnosis not present

## 2021-04-18 DIAGNOSIS — F331 Major depressive disorder, recurrent, moderate: Secondary | ICD-10-CM | POA: Diagnosis not present

## 2021-04-18 DIAGNOSIS — E871 Hypo-osmolality and hyponatremia: Secondary | ICD-10-CM | POA: Diagnosis not present

## 2021-04-18 DIAGNOSIS — E039 Hypothyroidism, unspecified: Secondary | ICD-10-CM | POA: Diagnosis not present

## 2021-04-18 DIAGNOSIS — M1990 Unspecified osteoarthritis, unspecified site: Secondary | ICD-10-CM | POA: Diagnosis not present

## 2021-04-18 DIAGNOSIS — K219 Gastro-esophageal reflux disease without esophagitis: Secondary | ICD-10-CM | POA: Diagnosis not present

## 2021-04-18 DIAGNOSIS — E875 Hyperkalemia: Secondary | ICD-10-CM | POA: Diagnosis not present

## 2021-04-18 DIAGNOSIS — I10 Essential (primary) hypertension: Secondary | ICD-10-CM | POA: Diagnosis not present

## 2021-04-18 DIAGNOSIS — E782 Mixed hyperlipidemia: Secondary | ICD-10-CM | POA: Diagnosis not present

## 2021-04-18 DIAGNOSIS — Z0001 Encounter for general adult medical examination with abnormal findings: Secondary | ICD-10-CM | POA: Diagnosis not present

## 2021-04-24 ENCOUNTER — Ambulatory Visit (INDEPENDENT_AMBULATORY_CARE_PROVIDER_SITE_OTHER): Payer: Medicare HMO | Admitting: Orthopedic Surgery

## 2021-04-24 ENCOUNTER — Other Ambulatory Visit: Payer: Self-pay

## 2021-04-24 DIAGNOSIS — Z96651 Presence of right artificial knee joint: Secondary | ICD-10-CM

## 2021-04-24 NOTE — Progress Notes (Signed)
Chief Complaint  Patient presents with   Routine Post Op    DOS 01/29/21//s/p TKR right   Post op   The wound closed and she feels good   She has ROM of 115  Walks w/o support   F/u 3 months or 1 yr   Encounter Diagnosis  Name Primary?   S/P TKR (total knee replacement), right 01/29/21 secondary wound closure 02/22/21 wound dehiscence Yes

## 2021-05-29 DIAGNOSIS — Z0100 Encounter for examination of eyes and vision without abnormal findings: Secondary | ICD-10-CM | POA: Diagnosis not present

## 2021-05-29 DIAGNOSIS — H524 Presbyopia: Secondary | ICD-10-CM | POA: Diagnosis not present

## 2021-06-13 DIAGNOSIS — H6123 Impacted cerumen, bilateral: Secondary | ICD-10-CM | POA: Diagnosis not present

## 2021-06-13 DIAGNOSIS — I10 Essential (primary) hypertension: Secondary | ICD-10-CM | POA: Diagnosis not present

## 2021-06-13 DIAGNOSIS — E782 Mixed hyperlipidemia: Secondary | ICD-10-CM | POA: Diagnosis not present

## 2021-06-13 DIAGNOSIS — G4700 Insomnia, unspecified: Secondary | ICD-10-CM | POA: Diagnosis not present

## 2021-06-13 DIAGNOSIS — N952 Postmenopausal atrophic vaginitis: Secondary | ICD-10-CM | POA: Diagnosis not present

## 2021-06-13 DIAGNOSIS — F331 Major depressive disorder, recurrent, moderate: Secondary | ICD-10-CM | POA: Diagnosis not present

## 2021-06-13 DIAGNOSIS — M1990 Unspecified osteoarthritis, unspecified site: Secondary | ICD-10-CM | POA: Diagnosis not present

## 2021-06-13 DIAGNOSIS — E039 Hypothyroidism, unspecified: Secondary | ICD-10-CM | POA: Diagnosis not present

## 2021-06-13 DIAGNOSIS — K219 Gastro-esophageal reflux disease without esophagitis: Secondary | ICD-10-CM | POA: Diagnosis not present

## 2021-06-21 ENCOUNTER — Ambulatory Visit: Payer: Medicare HMO | Admitting: Podiatry

## 2021-06-21 ENCOUNTER — Ambulatory Visit: Payer: Medicare HMO

## 2021-06-21 ENCOUNTER — Ambulatory Visit (INDEPENDENT_AMBULATORY_CARE_PROVIDER_SITE_OTHER): Payer: Medicare HMO

## 2021-06-21 ENCOUNTER — Other Ambulatory Visit: Payer: Self-pay

## 2021-06-21 DIAGNOSIS — M2012 Hallux valgus (acquired), left foot: Secondary | ICD-10-CM

## 2021-06-21 NOTE — Progress Notes (Signed)
g

## 2021-06-25 NOTE — Progress Notes (Signed)
   Subjective: 80 y.o. female presents today as a new patient for evaluation of a bunion to the left foot.  Patient states that in 2005 she had a bunionectomy procedure to the left foot.  Patient states that it was never corrected appropriately.  She says that her left foot hurts.  She would like to have it evaluated.  It is constant pain with walking standing and with shoes.  She has been wearing a toe separator and wide fitting shoes with minimal improvement.  Pain is gradually increased over the past year   Past Medical History:  Diagnosis Date   Abnormal Pap smear    Anxiety    Cancer ([place])    skin   Depression    Hypercholesteremia 11/12/2012   Hypertension    Hyperthyroidism 11/12/2012   Rectocele 11/12/2012   Thyroid disease    Urge incontinence 11/12/2012   Vaginal Pap smear, abnormal       Objective: Physical Exam General: The patient is alert and oriented x3 in no acute distress.  Dermatology: Skin is cool, dry and supple bilateral lower extremities. Negative for open lesions or macerations.  Vascular: Palpable pedal pulses bilaterally. No edema or erythema noted. Capillary refill within normal limits.  Neurological: Epicritic and protective threshold grossly intact bilaterally.   Musculoskeletal Exam: Clinical evidence of bunion deformity noted to the respective foot. There is moderate pain on palpation range of motion of the first MPJ. Lateral deviation of the hallux noted consistent with hallux abductovalgus.  Radiographic Exam: Increased intermetatarsal angle greater than 15 with a hallux abductus angle greater than 30 noted on AP view. Moderate degenerative changes noted within the first MPJ.  Orthopedic screws x2 noted to the first metatarsal  Assessment: 1. HAV w/ bunion deformity left 2. H/o bunionectomy surgery LT 2005.    Plan of Care:  1. Patient was evaluated. X-Rays reviewed.  Unfortunately the patient had a poor outcome with bunion surgery.  It appears  that the intermetatarsal angle was never appropriately reduced.  Explained to the patient the need for a revisional bunion surgery to reduce the IM angle and possibly perform an Aiken osteotomy.  The patient agrees to proceed with surgery 2. Today we discussed the conservative versus surgical management of the presenting pathology. The patient opts for surgical management. All possible complications and details of the procedure were explained. All patient questions were answered. No guarantees were expressed or implied. 3. Authorization for surgery was initiated today. Surgery will consist of removal of hardware left first metatarsal.  Bunionectomy with osteotomy left. 4.  Return to clinic 1 week postop    Edrick Kins, DPM [facility]  Dr. Edrick Kins, [address]                                        [place], [place] [address]                Office [phone]  Fax [phone]

## 2021-07-04 ENCOUNTER — Telehealth: Payer: Self-pay | Admitting: Podiatry

## 2021-07-04 NOTE — Telephone Encounter (Signed)
Patient called stating she is having surgery in January. Patient states she is wondering if she is gonna have a shoe or boot. Patient states she doesn't want to have a boot. Patient would like to talk with someone about it

## 2021-07-17 DIAGNOSIS — E782 Mixed hyperlipidemia: Secondary | ICD-10-CM | POA: Diagnosis not present

## 2021-07-17 DIAGNOSIS — I10 Essential (primary) hypertension: Secondary | ICD-10-CM | POA: Diagnosis not present

## 2021-08-06 DIAGNOSIS — I10 Essential (primary) hypertension: Secondary | ICD-10-CM | POA: Diagnosis not present

## 2021-08-06 DIAGNOSIS — G8929 Other chronic pain: Secondary | ICD-10-CM | POA: Diagnosis not present

## 2021-08-06 DIAGNOSIS — R6889 Other general symptoms and signs: Secondary | ICD-10-CM | POA: Diagnosis not present

## 2021-08-06 DIAGNOSIS — M5441 Lumbago with sciatica, right side: Secondary | ICD-10-CM | POA: Diagnosis not present

## 2021-08-06 DIAGNOSIS — M5416 Radiculopathy, lumbar region: Secondary | ICD-10-CM | POA: Diagnosis not present

## 2021-08-08 ENCOUNTER — Ambulatory Visit: Payer: Medicare HMO

## 2021-08-08 ENCOUNTER — Ambulatory Visit: Payer: Medicare HMO | Admitting: Orthopedic Surgery

## 2021-08-08 ENCOUNTER — Other Ambulatory Visit: Payer: Self-pay

## 2021-08-08 DIAGNOSIS — T8484XS Pain due to internal orthopedic prosthetic devices, implants and grafts, sequela: Secondary | ICD-10-CM

## 2021-08-08 DIAGNOSIS — M7051 Other bursitis of knee, right knee: Secondary | ICD-10-CM

## 2021-08-08 DIAGNOSIS — M1711 Unilateral primary osteoarthritis, right knee: Secondary | ICD-10-CM

## 2021-08-08 DIAGNOSIS — Z96651 Presence of right artificial knee joint: Secondary | ICD-10-CM | POA: Diagnosis not present

## 2021-08-08 DIAGNOSIS — T8131XD Disruption of external operation (surgical) wound, not elsewhere classified, subsequent encounter: Secondary | ICD-10-CM

## 2021-08-08 DIAGNOSIS — M7050 Other bursitis of knee, unspecified knee: Secondary | ICD-10-CM

## 2021-08-08 NOTE — Progress Notes (Signed)
Chief Complaint  Patient presents with   Knee Pain    S/p TKR RIGHT DOS 01/29/21   This is a 80 year old female with a 24-month status post right total knee that was complicated by need for secondary wound closure for wound dehiscence  She presents now with burning pain around her patella pain on the medial side of her knee and some pain down into her shin  She is being evaluated by Dr. Arnoldo Morale for neck and back pain  She has been taking up to 2000 mg of Tylenol a day and an occasional tramadol she is also on meloxicam  She also wants to have me look at a area just on the distal portion of the incision which she recalls unknot  Right knee exam she has full extension good quadriceps function and 125 degrees of flexion the knee feels very stable  She is tender on the medial side of the joint along the pes tendons and she is also tender down her shin all the way to the foot  There is a suture knot which is nonabsorbable suture beneath the skin  X-ray was obtained overall alignment looks good although the patella has a significant amount of tilt   Encounter Diagnoses  Name Primary?   Unilateral primary osteoarthritis, right knee Yes   S/P TKR (total knee replacement), right 01/29/21 secondary wound closure 02/22/21 wound dehiscence    Postoperative wound dehiscence, subsequent encounter secondary wound closure 02/22/21     Pes anserine bursitis right knee    Pain due to total knee replacement, sequela    Right now overall she is having good function I do not see a reason to do anything with the patella at this time  I will inject her bursa continue with topical IcyHot over the pes bursa as well  She is getting imaging done for her spine to address the lower leg pain which I do not feel is related to the replacement  She will follow-up in 3 months instead of 6  procedure injection pes bursa Site confirmed by timeout right knee Verbal consent given to perform the injection Pes bursitis  right knee Skin was cleaned with alcohol Anesthesia ethyl chloride 25-gauge needle was used to inject the pes bursa and there were no complications

## 2021-08-09 ENCOUNTER — Other Ambulatory Visit (HOSPITAL_COMMUNITY): Payer: Self-pay | Admitting: Student

## 2021-08-09 ENCOUNTER — Other Ambulatory Visit: Payer: Self-pay | Admitting: Student

## 2021-08-09 DIAGNOSIS — G8929 Other chronic pain: Secondary | ICD-10-CM

## 2021-08-16 DIAGNOSIS — I10 Essential (primary) hypertension: Secondary | ICD-10-CM | POA: Diagnosis not present

## 2021-08-16 DIAGNOSIS — E782 Mixed hyperlipidemia: Secondary | ICD-10-CM | POA: Diagnosis not present

## 2021-08-22 ENCOUNTER — Ambulatory Visit (HOSPITAL_COMMUNITY)
Admission: RE | Admit: 2021-08-22 | Discharge: 2021-08-22 | Disposition: A | Discharge status: Home / Self Care | Payer: Medicare HMO | Source: Ambulatory Visit | Attending: Student | Admitting: Student

## 2021-08-22 ENCOUNTER — Other Ambulatory Visit: Payer: Self-pay

## 2021-08-22 DIAGNOSIS — M48061 Spinal stenosis, lumbar region without neurogenic claudication: Secondary | ICD-10-CM | POA: Diagnosis not present

## 2021-08-22 DIAGNOSIS — G8929 Other chronic pain: Secondary | ICD-10-CM | POA: Diagnosis not present

## 2021-08-22 DIAGNOSIS — M5441 Lumbago with sciatica, right side: Secondary | ICD-10-CM | POA: Insufficient documentation

## 2021-09-13 ENCOUNTER — Encounter: Payer: Medicare HMO | Admitting: Podiatry

## 2021-09-18 DIAGNOSIS — E782 Mixed hyperlipidemia: Secondary | ICD-10-CM | POA: Diagnosis not present

## 2021-09-18 DIAGNOSIS — E039 Hypothyroidism, unspecified: Secondary | ICD-10-CM | POA: Diagnosis not present

## 2021-09-18 DIAGNOSIS — R7301 Impaired fasting glucose: Secondary | ICD-10-CM | POA: Diagnosis not present

## 2021-09-20 ENCOUNTER — Encounter: Payer: Medicare HMO | Admitting: Podiatry

## 2021-09-23 DIAGNOSIS — R42 Dizziness and giddiness: Secondary | ICD-10-CM | POA: Diagnosis not present

## 2021-09-23 DIAGNOSIS — E782 Mixed hyperlipidemia: Secondary | ICD-10-CM | POA: Diagnosis not present

## 2021-09-23 DIAGNOSIS — N952 Postmenopausal atrophic vaginitis: Secondary | ICD-10-CM | POA: Diagnosis not present

## 2021-09-23 DIAGNOSIS — I10 Essential (primary) hypertension: Secondary | ICD-10-CM | POA: Diagnosis not present

## 2021-09-23 DIAGNOSIS — F331 Major depressive disorder, recurrent, moderate: Secondary | ICD-10-CM | POA: Diagnosis not present

## 2021-09-23 DIAGNOSIS — K219 Gastro-esophageal reflux disease without esophagitis: Secondary | ICD-10-CM | POA: Diagnosis not present

## 2021-09-23 DIAGNOSIS — M1990 Unspecified osteoarthritis, unspecified site: Secondary | ICD-10-CM | POA: Diagnosis not present

## 2021-09-23 DIAGNOSIS — E039 Hypothyroidism, unspecified: Secondary | ICD-10-CM | POA: Diagnosis not present

## 2021-09-23 DIAGNOSIS — G4700 Insomnia, unspecified: Secondary | ICD-10-CM | POA: Diagnosis not present

## 2021-10-04 ENCOUNTER — Encounter: Payer: Medicare HMO | Admitting: Podiatry

## 2021-11-06 ENCOUNTER — Other Ambulatory Visit: Payer: Self-pay

## 2021-11-06 ENCOUNTER — Ambulatory Visit: Payer: Medicare HMO | Admitting: Orthopedic Surgery

## 2021-11-06 DIAGNOSIS — Z96651 Presence of right artificial knee joint: Secondary | ICD-10-CM

## 2021-11-06 NOTE — Progress Notes (Signed)
FOLLOW UP  ? ?Encounter Diagnosis  ?Name Primary?  ? S/P TKR (total knee replacement), right 01/29/21 secondary wound closure 02/22/21 wound dehiscence Yes  ? ? ? ?Chief Complaint  ?Patient presents with  ? Pain  ?  DOS 01/29/21 02/22/21 burns at patella and pain medial and lateral  ? Knee Pain  ? ? ? ?Ms. Abby is doing well after her knee replacement she has some subtle pain in her thigh and her hamstrings but she also has spinal stenosis ? ?She has very well-functioning knee with good extension good strength 130 degrees of knee flexion ? ?She does have some tilt on her patellar x-ray ? ?This could contribute to some anterior knee pain but does not seem to be bothering her too much ? ?We will continue to see her as her appointment is scheduled for 1 year follow-up x-ray ?

## 2021-12-23 ENCOUNTER — Other Ambulatory Visit (HOSPITAL_COMMUNITY): Payer: Self-pay | Admitting: Internal Medicine

## 2021-12-23 DIAGNOSIS — Z1231 Encounter for screening mammogram for malignant neoplasm of breast: Secondary | ICD-10-CM

## 2021-12-27 ENCOUNTER — Ambulatory Visit (HOSPITAL_COMMUNITY)
Admission: RE | Admit: 2021-12-27 | Discharge: 2021-12-27 | Disposition: A | Discharge status: Home / Self Care | Payer: Medicare HMO | Source: Ambulatory Visit | Attending: Internal Medicine | Admitting: Internal Medicine

## 2021-12-27 DIAGNOSIS — Z1231 Encounter for screening mammogram for malignant neoplasm of breast: Secondary | ICD-10-CM | POA: Insufficient documentation

## 2021-12-30 ENCOUNTER — Telehealth: Payer: Self-pay

## 2021-12-30 NOTE — Telephone Encounter (Signed)
Patient called stating that her knee has gotten so bad that it is keeping her more at home. ?She is wanting to know if there is anything she can do or should she come in sooner than ?her appointment on 02/07/22. ? ? ?Please advise ?

## 2021-12-30 NOTE — Telephone Encounter (Signed)
May 23, 24, 25, 26 set up appt any of these days

## 2022-01-08 ENCOUNTER — Ambulatory Visit: Payer: Medicare HMO | Admitting: Orthopedic Surgery

## 2022-01-08 DIAGNOSIS — Z96651 Presence of right artificial knee joint: Secondary | ICD-10-CM

## 2022-01-08 DIAGNOSIS — M25561 Pain in right knee: Secondary | ICD-10-CM

## 2022-01-08 DIAGNOSIS — G8929 Other chronic pain: Secondary | ICD-10-CM

## 2022-01-08 NOTE — Addendum Note (Signed)
Addended byCandice Camp on: 01/08/2022 10:59 AM   Modules accepted: Orders

## 2022-01-08 NOTE — Progress Notes (Signed)
Chief Complaint  Patient presents with   Knee Pain    RT knee/hx of TKR/ has a burning sensation. When she bends her knee it feels like skin is ripping apart. Having a lot of pain in the back of knee    81 year old female had a right total knee complicated by wound dehiscence with secondary closure back in June 2022 in July 2022.  She presented 1 month earlier than her scheduled appointment complaining of soreness around the knee medially and laterally with a feeling that the knee is not hers  Examination in multiple positions reveals a positive anterior drawer test positive Lachman test stable medial collateral ligament test tenderness around the soft tissues medially and laterally  We did some lidocaine injection tenderness medially and laterally and this did relieve some of the discomfort  We put her in a hinged brace and ordered some physical therapy  I suspect she has some microinstability  We will see her in a month to see how the bracing and therapy if that helps  If not we will have to consider a revision.

## 2022-01-22 DIAGNOSIS — Z96651 Presence of right artificial knee joint: Secondary | ICD-10-CM | POA: Diagnosis not present

## 2022-01-22 DIAGNOSIS — Z471 Aftercare following joint replacement surgery: Secondary | ICD-10-CM | POA: Diagnosis not present

## 2022-01-22 DIAGNOSIS — M6281 Muscle weakness (generalized): Secondary | ICD-10-CM | POA: Diagnosis not present

## 2022-01-22 DIAGNOSIS — R2689 Other abnormalities of gait and mobility: Secondary | ICD-10-CM | POA: Diagnosis not present

## 2022-01-28 DIAGNOSIS — M6281 Muscle weakness (generalized): Secondary | ICD-10-CM | POA: Diagnosis not present

## 2022-01-28 DIAGNOSIS — Z96651 Presence of right artificial knee joint: Secondary | ICD-10-CM | POA: Diagnosis not present

## 2022-01-28 DIAGNOSIS — Z471 Aftercare following joint replacement surgery: Secondary | ICD-10-CM | POA: Diagnosis not present

## 2022-01-28 DIAGNOSIS — R2689 Other abnormalities of gait and mobility: Secondary | ICD-10-CM | POA: Diagnosis not present

## 2022-01-29 DIAGNOSIS — Z471 Aftercare following joint replacement surgery: Secondary | ICD-10-CM | POA: Diagnosis not present

## 2022-01-29 DIAGNOSIS — Z96651 Presence of right artificial knee joint: Secondary | ICD-10-CM | POA: Diagnosis not present

## 2022-01-29 DIAGNOSIS — M6281 Muscle weakness (generalized): Secondary | ICD-10-CM | POA: Diagnosis not present

## 2022-01-29 DIAGNOSIS — R2689 Other abnormalities of gait and mobility: Secondary | ICD-10-CM | POA: Diagnosis not present

## 2022-01-30 ENCOUNTER — Ambulatory Visit: Payer: Medicare HMO | Admitting: Orthopedic Surgery

## 2022-01-30 ENCOUNTER — Ambulatory Visit (INDEPENDENT_AMBULATORY_CARE_PROVIDER_SITE_OTHER): Payer: Medicare HMO

## 2022-01-30 DIAGNOSIS — M25561 Pain in right knee: Secondary | ICD-10-CM | POA: Diagnosis not present

## 2022-01-30 DIAGNOSIS — G8929 Other chronic pain: Secondary | ICD-10-CM | POA: Diagnosis not present

## 2022-01-30 DIAGNOSIS — Z96651 Presence of right artificial knee joint: Secondary | ICD-10-CM | POA: Diagnosis not present

## 2022-01-30 NOTE — Progress Notes (Signed)
FOLLOW UP   Encounter Diagnoses  Name Primary?   S/P TKR (total knee replacement), right Yes   Chronic knee pain after total replacement of right knee joint      Chief Complaint  Patient presents with   s/p TKR    RT DOS 01/29/21     The physical therapy and bracing seems to have helped her symptoms.  I think she has some subtle micro flexion instability  X-rays look fine femoral offset looks good sizing looks good  Recommend 46-monthfollow-up, continue bracing and exercise

## 2022-02-03 DIAGNOSIS — M6281 Muscle weakness (generalized): Secondary | ICD-10-CM | POA: Diagnosis not present

## 2022-02-03 DIAGNOSIS — R2689 Other abnormalities of gait and mobility: Secondary | ICD-10-CM | POA: Diagnosis not present

## 2022-02-03 DIAGNOSIS — Z471 Aftercare following joint replacement surgery: Secondary | ICD-10-CM | POA: Diagnosis not present

## 2022-02-03 DIAGNOSIS — Z96651 Presence of right artificial knee joint: Secondary | ICD-10-CM | POA: Diagnosis not present

## 2022-02-10 DIAGNOSIS — R2689 Other abnormalities of gait and mobility: Secondary | ICD-10-CM | POA: Diagnosis not present

## 2022-02-10 DIAGNOSIS — M6281 Muscle weakness (generalized): Secondary | ICD-10-CM | POA: Diagnosis not present

## 2022-02-10 DIAGNOSIS — Z96651 Presence of right artificial knee joint: Secondary | ICD-10-CM | POA: Diagnosis not present

## 2022-02-10 DIAGNOSIS — Z471 Aftercare following joint replacement surgery: Secondary | ICD-10-CM | POA: Diagnosis not present

## 2022-02-19 DIAGNOSIS — M6281 Muscle weakness (generalized): Secondary | ICD-10-CM | POA: Diagnosis not present

## 2022-02-19 DIAGNOSIS — R2689 Other abnormalities of gait and mobility: Secondary | ICD-10-CM | POA: Diagnosis not present

## 2022-02-19 DIAGNOSIS — Z471 Aftercare following joint replacement surgery: Secondary | ICD-10-CM | POA: Diagnosis not present

## 2022-02-19 DIAGNOSIS — Z96651 Presence of right artificial knee joint: Secondary | ICD-10-CM | POA: Diagnosis not present

## 2022-02-24 DIAGNOSIS — R2689 Other abnormalities of gait and mobility: Secondary | ICD-10-CM | POA: Diagnosis not present

## 2022-02-24 DIAGNOSIS — Z471 Aftercare following joint replacement surgery: Secondary | ICD-10-CM | POA: Diagnosis not present

## 2022-02-24 DIAGNOSIS — M6281 Muscle weakness (generalized): Secondary | ICD-10-CM | POA: Diagnosis not present

## 2022-02-24 DIAGNOSIS — Z96651 Presence of right artificial knee joint: Secondary | ICD-10-CM | POA: Diagnosis not present

## 2022-02-27 DIAGNOSIS — R2689 Other abnormalities of gait and mobility: Secondary | ICD-10-CM | POA: Diagnosis not present

## 2022-02-27 DIAGNOSIS — M6281 Muscle weakness (generalized): Secondary | ICD-10-CM | POA: Diagnosis not present

## 2022-02-27 DIAGNOSIS — Z471 Aftercare following joint replacement surgery: Secondary | ICD-10-CM | POA: Diagnosis not present

## 2022-02-27 DIAGNOSIS — Z96651 Presence of right artificial knee joint: Secondary | ICD-10-CM | POA: Diagnosis not present

## 2022-03-06 DIAGNOSIS — Z96651 Presence of right artificial knee joint: Secondary | ICD-10-CM | POA: Diagnosis not present

## 2022-03-06 DIAGNOSIS — R2689 Other abnormalities of gait and mobility: Secondary | ICD-10-CM | POA: Diagnosis not present

## 2022-03-06 DIAGNOSIS — M6281 Muscle weakness (generalized): Secondary | ICD-10-CM | POA: Diagnosis not present

## 2022-03-06 DIAGNOSIS — Z471 Aftercare following joint replacement surgery: Secondary | ICD-10-CM | POA: Diagnosis not present

## 2022-03-10 DIAGNOSIS — M6281 Muscle weakness (generalized): Secondary | ICD-10-CM | POA: Diagnosis not present

## 2022-03-10 DIAGNOSIS — Z471 Aftercare following joint replacement surgery: Secondary | ICD-10-CM | POA: Diagnosis not present

## 2022-03-10 DIAGNOSIS — R2689 Other abnormalities of gait and mobility: Secondary | ICD-10-CM | POA: Diagnosis not present

## 2022-03-10 DIAGNOSIS — Z96651 Presence of right artificial knee joint: Secondary | ICD-10-CM | POA: Diagnosis not present

## 2022-03-13 DIAGNOSIS — M6281 Muscle weakness (generalized): Secondary | ICD-10-CM | POA: Diagnosis not present

## 2022-03-13 DIAGNOSIS — Z471 Aftercare following joint replacement surgery: Secondary | ICD-10-CM | POA: Diagnosis not present

## 2022-03-13 DIAGNOSIS — Z96651 Presence of right artificial knee joint: Secondary | ICD-10-CM | POA: Diagnosis not present

## 2022-03-13 DIAGNOSIS — R2689 Other abnormalities of gait and mobility: Secondary | ICD-10-CM | POA: Diagnosis not present

## 2022-03-19 DIAGNOSIS — E782 Mixed hyperlipidemia: Secondary | ICD-10-CM | POA: Diagnosis not present

## 2022-03-19 DIAGNOSIS — E039 Hypothyroidism, unspecified: Secondary | ICD-10-CM | POA: Diagnosis not present

## 2022-03-19 DIAGNOSIS — R7301 Impaired fasting glucose: Secondary | ICD-10-CM | POA: Diagnosis not present

## 2022-03-24 DIAGNOSIS — I10 Essential (primary) hypertension: Secondary | ICD-10-CM | POA: Diagnosis not present

## 2022-03-24 DIAGNOSIS — F331 Major depressive disorder, recurrent, moderate: Secondary | ICD-10-CM | POA: Diagnosis not present

## 2022-03-24 DIAGNOSIS — G4700 Insomnia, unspecified: Secondary | ICD-10-CM | POA: Diagnosis not present

## 2022-03-24 DIAGNOSIS — E039 Hypothyroidism, unspecified: Secondary | ICD-10-CM | POA: Diagnosis not present

## 2022-03-24 DIAGNOSIS — N952 Postmenopausal atrophic vaginitis: Secondary | ICD-10-CM | POA: Diagnosis not present

## 2022-03-24 DIAGNOSIS — R42 Dizziness and giddiness: Secondary | ICD-10-CM | POA: Diagnosis not present

## 2022-03-24 DIAGNOSIS — E782 Mixed hyperlipidemia: Secondary | ICD-10-CM | POA: Diagnosis not present

## 2022-03-24 DIAGNOSIS — M1990 Unspecified osteoarthritis, unspecified site: Secondary | ICD-10-CM | POA: Diagnosis not present

## 2022-03-24 DIAGNOSIS — K219 Gastro-esophageal reflux disease without esophagitis: Secondary | ICD-10-CM | POA: Diagnosis not present

## 2022-04-07 ENCOUNTER — Ambulatory Visit: Payer: Medicare HMO | Admitting: Podiatry

## 2022-04-10 DIAGNOSIS — Z0000 Encounter for general adult medical examination without abnormal findings: Secondary | ICD-10-CM | POA: Diagnosis not present

## 2022-04-15 ENCOUNTER — Ambulatory Visit: Payer: Medicare HMO | Admitting: Podiatry

## 2022-04-15 ENCOUNTER — Ambulatory Visit (INDEPENDENT_AMBULATORY_CARE_PROVIDER_SITE_OTHER): Payer: Medicare HMO

## 2022-04-15 DIAGNOSIS — M2012 Hallux valgus (acquired), left foot: Secondary | ICD-10-CM | POA: Diagnosis not present

## 2022-04-15 NOTE — Progress Notes (Signed)
Chief Complaint  Patient presents with   Bunions    The patient is here for left foot bunion surgery consult, she states that she is ready for surgery.    Subjective: 81 y.o. female very healthy presents today for follow-up evaluation of a bunion to the left foot.  Patient was originally seen in the office on 06/21/2021 and at that time we were going to proceed with surgery to correct for her hallux valgus deformity of the left foot.  Since that time she ended up having a knee replacement to the right lower extremity.  She has been healing with that.  She says that she continues to have pain and tenderness to the left great toe joint and bunion area.  She presents today because she would like to have the bunion corrected for and she has healed from her knee replacement to the right lower extremity.    Patient states that in 2005 she had a bunionectomy procedure to the left foot.  Patient states that it was never corrected appropriately.  She says that her left foot hurts.   It is constant pain with walking standing and with shoes.  She has been wearing a toe separator and wide fitting shoes with minimal improvement.  Pain continues to be constant despite different shoe gear modifications and arch supports   Past Medical History:  Diagnosis Date   Abnormal Pap smear    Anxiety    Cancer ([place])    skin   Depression    Hypercholesteremia 11/12/2012   Hypertension    Hyperthyroidism 11/12/2012   Rectocele 11/12/2012   Thyroid disease    Urge incontinence 11/12/2012   Vaginal Pap smear, abnormal    Past Surgical History:  Procedure Laterality Date   ABDOMINAL HYSTERECTOMY     bladder and bowel     mesh put in   BLADDER SURGERY     sling x2   EYE SURGERY     SECONDARY CLOSURE OF WOUND Right 02/22/2021   Procedure: MINOR SECONDARY CLOSURE OF WOUND;  Surgeon: Carole Civil, MD;  Location: [facility];  Service: Orthopedics;  Laterality: Right;   TOTAL KNEE ARTHROPLASTY Right 01/29/2021    Procedure: RIGHT TOTAL KNEE ARTHROPLASTY;  Surgeon: Carole Civil, MD;  Location: [facility];  Service: Orthopedics;  Laterality: Right;   No Known Allergies    Objective: Physical Exam General: The patient is alert and oriented x3 in no acute distress.  Dermatology: Skin is cool, dry and supple bilateral lower extremities. Negative for open lesions or macerations.  Vascular: Palpable pedal pulses bilaterally. No edema or erythema noted. Capillary refill within normal limits.  Neurological: Epicritic and protective threshold grossly intact bilaterally.   Musculoskeletal Exam: Clinical evidence of bunion deformity noted to the respective foot. There is moderate pain on palpation range of motion of the first MPJ. Lateral deviation of the hallux noted consistent with hallux abductovalgus.  Radiographic Exam 04/15/2022 LT foot: Increased intermetatarsal angle greater than 15 with a hallux abductus angle greater than 30 noted on AP view. Moderate degenerative changes noted within the first MPJ.  Orthopedic screws x2 noted to the first metatarsal  Assessment: 1. HAV w/ bunion deformity left 2. H/o bunionectomy surgery LT 2005.    Plan of Care:  1. Patient was evaluated. X-Rays reviewed.  Unfortunately the patient had a poor outcome with bunion surgery.  It appears that the intermetatarsal angle was never appropriately reduced.  Explained to the patient the need for a revisional  bunion surgery to reduce the IM angle and possibly perform an Aiken osteotomy.  The patient agrees to proceed with surgery 2.  Risk benefits advantages and disadvantages were discussed with the patient.  All patient questions answered.  Surgical consent was reviewed again today.  No guarantees were expressed or implied.  The patient would like to proceed now with surgery 3. Authorization for surgery was re-initiated today. Surgery will consist of removal of hardware left first metatarsal.  Bunionectomy with osteotomy  left. 4.  Return to clinic 1 week postop    Edrick Kins, DPM [facility]  Dr. Edrick Kins, [address]                                        [place], [place] [address]                Office [phone]  Fax [phone]

## 2022-04-18 HISTORY — PX: BUNIONECTOMY: SHX129

## 2022-04-22 ENCOUNTER — Telehealth: Payer: Self-pay | Admitting: Urology

## 2022-04-22 DIAGNOSIS — D225 Melanocytic nevi of trunk: Secondary | ICD-10-CM | POA: Diagnosis not present

## 2022-04-22 DIAGNOSIS — Z1283 Encounter for screening for malignant neoplasm of skin: Secondary | ICD-10-CM | POA: Diagnosis not present

## 2022-04-22 NOTE — Telephone Encounter (Signed)
DOS - 05/01/22  DOUBLE OSTEOTOMY LEFT --- 42876 REMOVAL FIXATION DEEP LEFT --- 20680  [employer] EFFECTIVE DATE - 08/18/21  PER [employer] WEBSITE FOR CPT CODE 81157 NO PRIOR AUTH IS REQUIRED. FOR CPT CODE 26203 HAS BEEN APPROVED, AUTH # [id number], GOOD FROM 05/01/22 - 07/30/22.  TRACKING # [id number]

## 2022-05-01 ENCOUNTER — Other Ambulatory Visit: Payer: Self-pay | Admitting: Podiatry

## 2022-05-01 DIAGNOSIS — Z4889 Encounter for other specified surgical aftercare: Secondary | ICD-10-CM | POA: Diagnosis not present

## 2022-05-01 DIAGNOSIS — M2012 Hallux valgus (acquired), left foot: Secondary | ICD-10-CM | POA: Diagnosis not present

## 2022-05-01 DIAGNOSIS — T8484XA Pain due to internal orthopedic prosthetic devices, implants and grafts, initial encounter: Secondary | ICD-10-CM | POA: Diagnosis not present

## 2022-05-01 DIAGNOSIS — M2042 Other hammer toe(s) (acquired), left foot: Secondary | ICD-10-CM | POA: Diagnosis not present

## 2022-05-01 DIAGNOSIS — G8918 Other acute postprocedural pain: Secondary | ICD-10-CM | POA: Diagnosis not present

## 2022-05-01 MED ORDER — OXYCODONE-ACETAMINOPHEN 5-325 MG PO TABS
1.0000 | ORAL_TABLET | ORAL | 0 refills | Status: DC | PRN
Start: 2022-05-01 — End: 2022-06-20

## 2022-05-01 NOTE — Progress Notes (Signed)
PRN postop 

## 2022-05-09 ENCOUNTER — Ambulatory Visit (INDEPENDENT_AMBULATORY_CARE_PROVIDER_SITE_OTHER): Payer: Medicare HMO | Admitting: Podiatry

## 2022-05-09 ENCOUNTER — Ambulatory Visit (INDEPENDENT_AMBULATORY_CARE_PROVIDER_SITE_OTHER): Payer: Medicare HMO

## 2022-05-09 DIAGNOSIS — M2012 Hallux valgus (acquired), left foot: Secondary | ICD-10-CM

## 2022-05-09 DIAGNOSIS — Z9889 Other specified postprocedural states: Secondary | ICD-10-CM

## 2022-05-09 DIAGNOSIS — R6889 Other general symptoms and signs: Secondary | ICD-10-CM | POA: Diagnosis not present

## 2022-05-09 NOTE — Progress Notes (Signed)
   Chief Complaint  Patient presents with   Post-op Follow-up    Patient is here for post op follow-up DOS 05/01/22     Subjective:  Patient presents today status post removal of orthopedic screws left first metatarsal with bunionectomy and double osteotomy left foot. DOS: 05/01/2022.  Patient states that she is doing well.  Minimal to no pain.  She is ambulating and weightbearing in the surgical boot as instructed.  No new complaints at this time  Past Medical History:  Diagnosis Date   Abnormal Pap smear    Anxiety    Cancer ([place])    skin   Depression    Hypercholesteremia 11/12/2012   Hypertension    Hyperthyroidism 11/12/2012   Rectocele 11/12/2012   Thyroid disease    Urge incontinence 11/12/2012   Vaginal Pap smear, abnormal     Past Surgical History:  Procedure Laterality Date   ABDOMINAL HYSTERECTOMY     bladder and bowel     mesh put in   BLADDER SURGERY     sling x2   EYE SURGERY     SECONDARY CLOSURE OF WOUND Right 02/22/2021   Procedure: MINOR SECONDARY CLOSURE OF WOUND;  Surgeon: Carole Civil, MD;  Location: [facility];  Service: Orthopedics;  Laterality: Right;   TOTAL KNEE ARTHROPLASTY Right 01/29/2021   Procedure: RIGHT TOTAL KNEE ARTHROPLASTY;  Surgeon: Carole Civil, MD;  Location: [facility];  Service: Orthopedics;  Laterality: Right;    No Known Allergies  Objective/Physical Exam Neurovascular status intact.  Skin incisions appear to be well coapted with sutures intact. No sign of infectious process noted. No dehiscence. No active bleeding noted. Moderate edema noted to the surgical extremity.  Radiographic Exam:  Orthopedic hardware and osteotomies sites appear to be stable with routine healing.  The most proximal orthopedic screw to the first metatarsal does appear to be backing out slightly.  We will simply observe for now  Assessment: 1. s/p removal of orthopedic screws with bunionectomy and double osteotomy LT foot. DOS: 05/01/2022   Plan of  Care:  1. Patient was evaluated. X-rays reviewed 2.  Continue minimal weightbearing in the cam boot 3.  The incision is well coapted.  She may begin washing and showering getting the foot wet 4.  Return to clinic 10 days for suture removal   Edrick Kins, DPM [facility]  Dr. Edrick Kins, DPM    [address], [place] [address]                Office [phone]  Fax [phone]

## 2022-05-15 ENCOUNTER — Telehealth: Payer: Self-pay | Admitting: *Deleted

## 2022-05-15 NOTE — Telephone Encounter (Signed)
Noted, thanks!

## 2022-05-15 NOTE — Telephone Encounter (Signed)
Patient called and she has figured how to pump the boot up, got it on and will leave it on.

## 2022-05-16 ENCOUNTER — Encounter: Payer: Medicare HMO | Admitting: Podiatry

## 2022-05-20 ENCOUNTER — Ambulatory Visit (INDEPENDENT_AMBULATORY_CARE_PROVIDER_SITE_OTHER): Payer: Medicare HMO | Admitting: Podiatry

## 2022-05-20 DIAGNOSIS — Z9889 Other specified postprocedural states: Secondary | ICD-10-CM

## 2022-05-20 NOTE — Progress Notes (Signed)
   Chief Complaint  Patient presents with   Post-op Follow-up    Patient is here for # 2 post op DOS 05/01/22, suture removal.    Subjective:  Patient presents today status post removal of orthopedic screws left first metatarsal with bunionectomy and double osteotomy left foot. DOS: 05/01/2022.  Patient states that she is doing well.  She has been weightbearing in the cam boot with no complaints.  Pain is very tolerable.  Past Medical History:  Diagnosis Date   Abnormal Pap smear    Anxiety    Cancer ([place])    skin   Depression    Hypercholesteremia 11/12/2012   Hypertension    Hyperthyroidism 11/12/2012   Rectocele 11/12/2012   Thyroid disease    Urge incontinence 11/12/2012   Vaginal Pap smear, abnormal     Past Surgical History:  Procedure Laterality Date   ABDOMINAL HYSTERECTOMY     bladder and bowel     mesh put in   BLADDER SURGERY     sling x2   EYE SURGERY     SECONDARY CLOSURE OF WOUND Right 02/22/2021   Procedure: MINOR SECONDARY CLOSURE OF WOUND;  Surgeon: Carole Civil, MD;  Location: [facility];  Service: Orthopedics;  Laterality: Right;   TOTAL KNEE ARTHROPLASTY Right 01/29/2021   Procedure: RIGHT TOTAL KNEE ARTHROPLASTY;  Surgeon: Carole Civil, MD;  Location: [facility];  Service: Orthopedics;  Laterality: Right;    No Known Allergies  Objective/Physical Exam Neurovascular status intact.  Skin incisions appear to be well coapted with sutures intact. No sign of infectious process noted. No dehiscence. No active bleeding noted.  Minimal edema noted to the surgical extremity.  Radiographic Exam LT foot 05/09/2022:  Orthopedic hardware and osteotomies sites appear to be stable with routine healing.  The most proximal orthopedic screw to the first metatarsal does appear to be backing out slightly.  We will simply observe for now  Assessment: 1. s/p removal of orthopedic screws with bunionectomy and double osteotomy LT foot. DOS: 05/01/2022   Plan of Care:  1.  Patient was evaluated.  2.  Sutures removed today 3.  Compression ankle sleeve dispensed.  Wear daily 4.  Continue weightbearing in the cam boot for an additional 3 weeks 5.  Return to clinic in 3 weeks for follow-up x-ray and to transition the patient out of the cam boot possibly   Edrick Kins, DPM [facility]  Dr. Edrick Kins, DPM    [address], [place] [address]                Office [phone]  Fax [phone]

## 2022-05-30 ENCOUNTER — Ambulatory Visit (INDEPENDENT_AMBULATORY_CARE_PROVIDER_SITE_OTHER): Payer: Medicare HMO

## 2022-05-30 ENCOUNTER — Ambulatory Visit (INDEPENDENT_AMBULATORY_CARE_PROVIDER_SITE_OTHER): Payer: Medicare HMO | Admitting: Podiatry

## 2022-05-30 DIAGNOSIS — M2012 Hallux valgus (acquired), left foot: Secondary | ICD-10-CM

## 2022-05-30 DIAGNOSIS — Z9889 Other specified postprocedural states: Secondary | ICD-10-CM

## 2022-05-30 NOTE — Progress Notes (Unsigned)
   Chief Complaint  Patient presents with   Post-op Follow-up    Patient is here for left foot post op, the patient states that the left foot is healing well.     Subjective:  Patient presents today status post removal of orthopedic screws left first metatarsal with bunionectomy and double osteotomy left foot. DOS: 05/01/2022.  Patient continues to do well.  She has been weightbearing in the cam boot.  No new complaints at this time  Past Medical History:  Diagnosis Date   Abnormal Pap smear    Anxiety    Cancer ([place])    skin   Depression    Hypercholesteremia 11/12/2012   Hypertension    Hyperthyroidism 11/12/2012   Rectocele 11/12/2012   Thyroid disease    Urge incontinence 11/12/2012   Vaginal Pap smear, abnormal     Past Surgical History:  Procedure Laterality Date   ABDOMINAL HYSTERECTOMY     bladder and bowel     mesh put in   BLADDER SURGERY     sling x2   EYE SURGERY     SECONDARY CLOSURE OF WOUND Right 02/22/2021   Procedure: MINOR SECONDARY CLOSURE OF WOUND;  Surgeon: Carole Civil, MD;  Location: [facility];  Service: Orthopedics;  Laterality: Right;   TOTAL KNEE ARTHROPLASTY Right 01/29/2021   Procedure: RIGHT TOTAL KNEE ARTHROPLASTY;  Surgeon: Carole Civil, MD;  Location: [facility];  Service: Orthopedics;  Laterality: Right;    No Known Allergies  Objective/Physical Exam Neurovascular status intact.  Skin incisions nicely healed.  No erythema or edema.  Minimal tenderness with palpation throughout the foot  Radiographic Exam LT foot 05/09/2022:  Orthopedic hardware and osteotomies sites appear to be stable with routine healing.  The most proximal orthopedic screw to the first metatarsal does appear to be backing out slightly.  We will simply observe for now  Assessment: 1. s/p removal of orthopedic screws with bunionectomy and double osteotomy LT foot. DOS: 05/01/2022   Plan of Care:  1. Patient was evaluated.  2.  Overall the patient is doing very  well.  We will go ahead and discontinue the cam boot.  Postsurgical shoe was dispensed.  WBAT 3.  Return to clinic in 3 weeks for follow-up x-ray  Edrick Kins, DPM [facility]  Dr. Edrick Kins, DPM    [address], [place] [address]                Office [phone]  Fax [phone]

## 2022-06-20 ENCOUNTER — Ambulatory Visit (INDEPENDENT_AMBULATORY_CARE_PROVIDER_SITE_OTHER): Payer: Medicare HMO | Admitting: Podiatry

## 2022-06-20 ENCOUNTER — Encounter: Payer: Self-pay | Admitting: Podiatry

## 2022-06-20 ENCOUNTER — Ambulatory Visit (INDEPENDENT_AMBULATORY_CARE_PROVIDER_SITE_OTHER): Payer: Medicare HMO

## 2022-06-20 DIAGNOSIS — M2012 Hallux valgus (acquired), left foot: Secondary | ICD-10-CM | POA: Diagnosis not present

## 2022-06-20 DIAGNOSIS — T8460XA Infection and inflammatory reaction due to internal fixation device of unspecified site, initial encounter: Secondary | ICD-10-CM

## 2022-06-20 DIAGNOSIS — Z9889 Other specified postprocedural states: Secondary | ICD-10-CM

## 2022-06-20 NOTE — Progress Notes (Signed)
   Chief Complaint  Patient presents with   Post-op Follow-up    Patient is here for left foot post op, the patient states that the left foot is healing well.     Subjective:  Patient presents today status post removal of orthopedic screws left first metatarsal with bunionectomy and double osteotomy left foot. DOS: 05/01/2022.  Patient continues to do very well.  She is now in regular shoes.  No new complaints at this time.  Overall she is very satisfied and happy with the outcome of her surgery  Past Medical History:  Diagnosis Date   Abnormal Pap smear    Anxiety    Cancer ([place])    skin   Depression    Hypercholesteremia 11/12/2012   Hypertension    Hyperthyroidism 11/12/2012   Rectocele 11/12/2012   Thyroid disease    Urge incontinence 11/12/2012   Vaginal Pap smear, abnormal     Past Surgical History:  Procedure Laterality Date   ABDOMINAL HYSTERECTOMY     bladder and bowel     mesh put in   BLADDER SURGERY     sling x2   EYE SURGERY     SECONDARY CLOSURE OF WOUND Right 02/22/2021   Procedure: MINOR SECONDARY CLOSURE OF WOUND;  Surgeon: Carole Civil, MD;  Location: [facility];  Service: Orthopedics;  Laterality: Right;   TOTAL KNEE ARTHROPLASTY Right 01/29/2021   Procedure: RIGHT TOTAL KNEE ARTHROPLASTY;  Surgeon: Carole Civil, MD;  Location: [facility];  Service: Orthopedics;  Laterality: Right;    No Known Allergies  Objective/Physical Exam Neurovascular status intact.  Skin incisions nicely healed.  No erythema or edema.  Negative for any tenderness throughout the foot.  Good range of motion of the first MTP joint.  Radiographic Exam LT foot 06/20/2022:  Orthopedic hardware and osteotomies sites appear to be stable with routine healing.  The most proximal orthopedic screw to the first metatarsal does appear to be backing out slightly.  We will simply observe for now  Assessment: 1. s/p removal of orthopedic screws with bunionectomy and double osteotomy LT foot.  DOS: 05/01/2022   Plan of Care:  1. Patient was evaluated.  Overall patient is very satisfied with the outcome 2.  Patient doing well.  She may now resume full activity no restrictions 3.  Recommend good supportive shoes and sneakers 4.  Sign off.  Return to clinic as needed  Edrick Kins, DPM [facility]  Dr. Edrick Kins, DPM    [address], [place] [address]                Office [phone]  Fax [phone]

## 2022-06-30 IMAGING — MG MM DIGITAL SCREENING BILAT W/ TOMO AND CAD
6 of 10 series · 6 of 30 positions shown · non-contrast
Comparison: Previous exam(s).

CLINICAL DATA: Screening.

EXAM:
DIGITAL SCREENING BILATERAL MAMMOGRAM WITH TOMOSYNTHESIS AND CAD
TECHNIQUE: Bilateral screening digital craniocaudal and mediolateral oblique
mammograms were obtained. Bilateral screening digital breast
tomosynthesis was performed. The images were evaluated with
computer-aided detection.

[L MLO synth-2D]
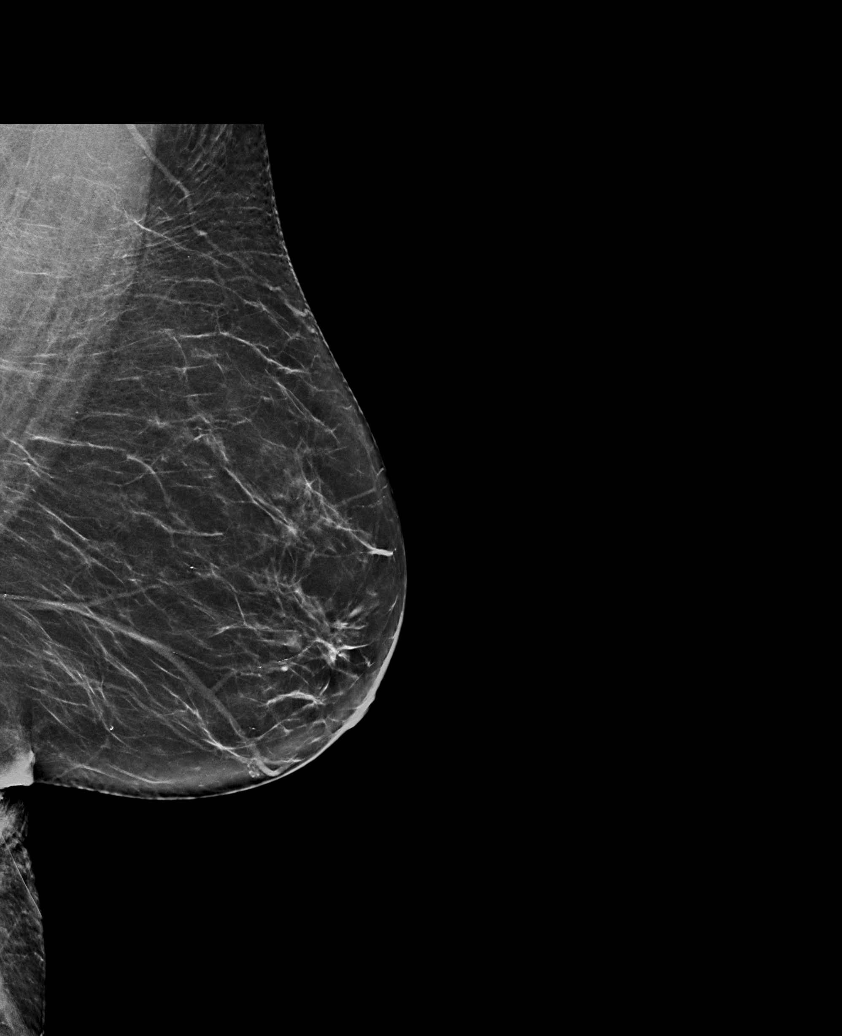

[R MLO synth-2D (1 of 2)]
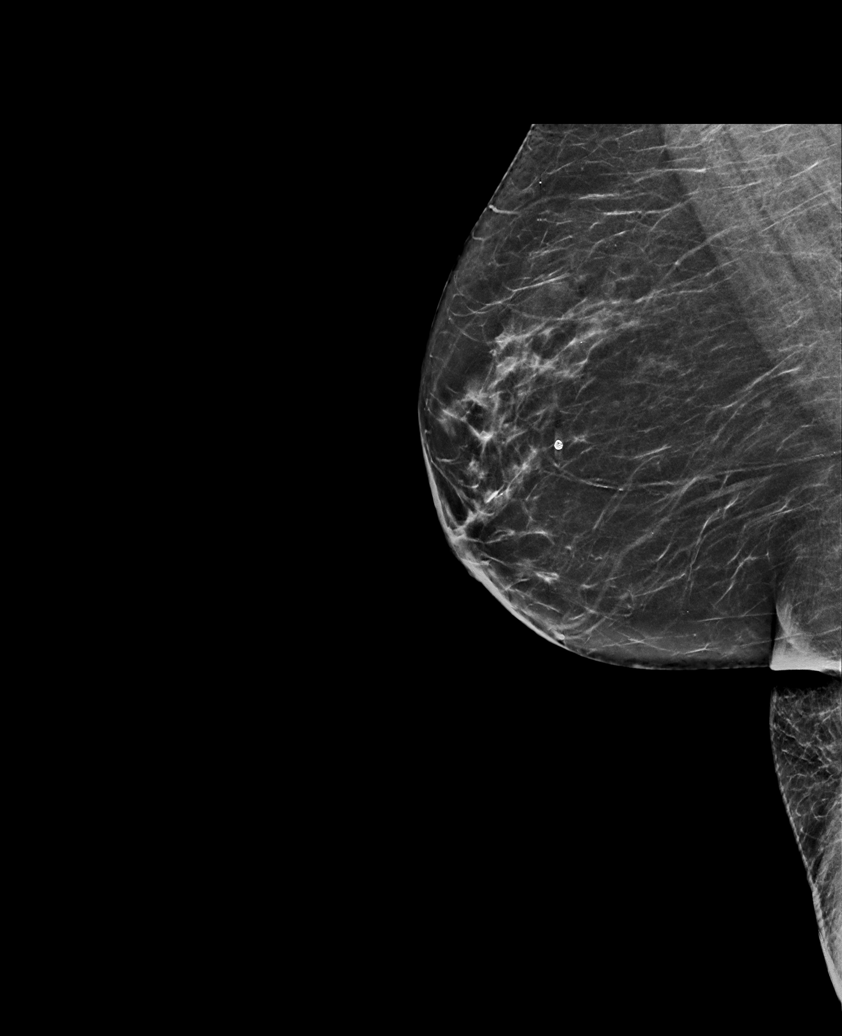

[R CC synth-2D]
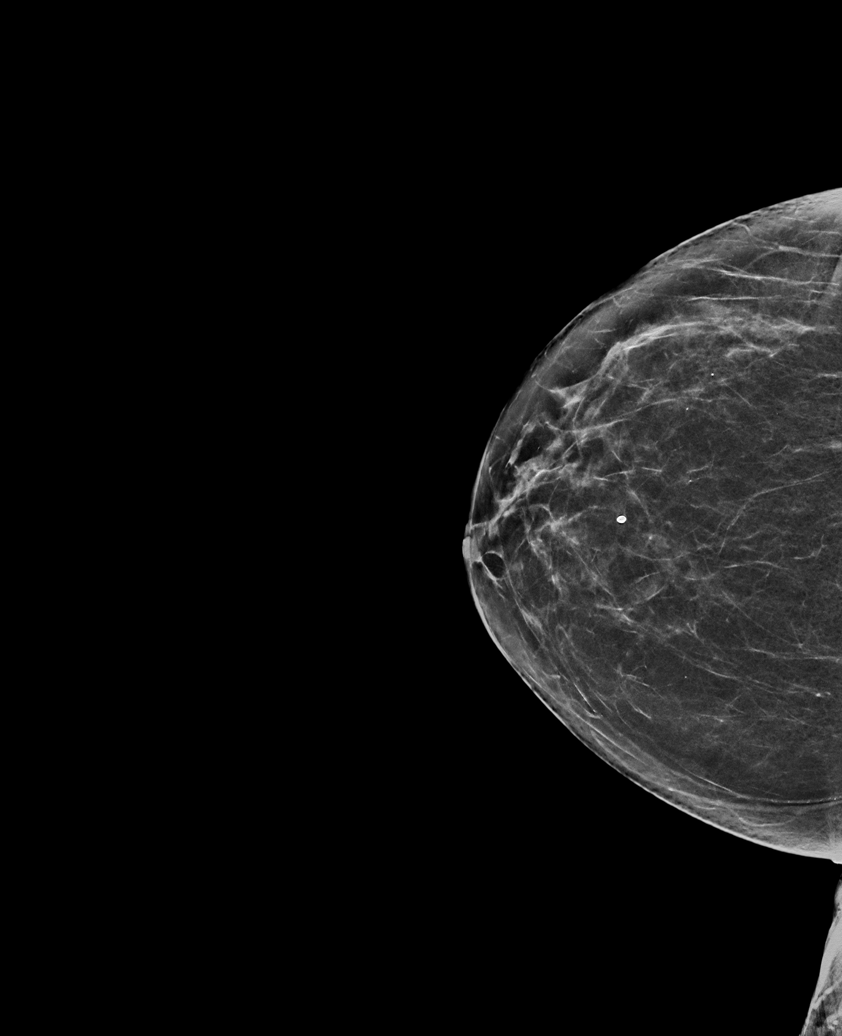

[R MLO synth-2D (2 of 2)]
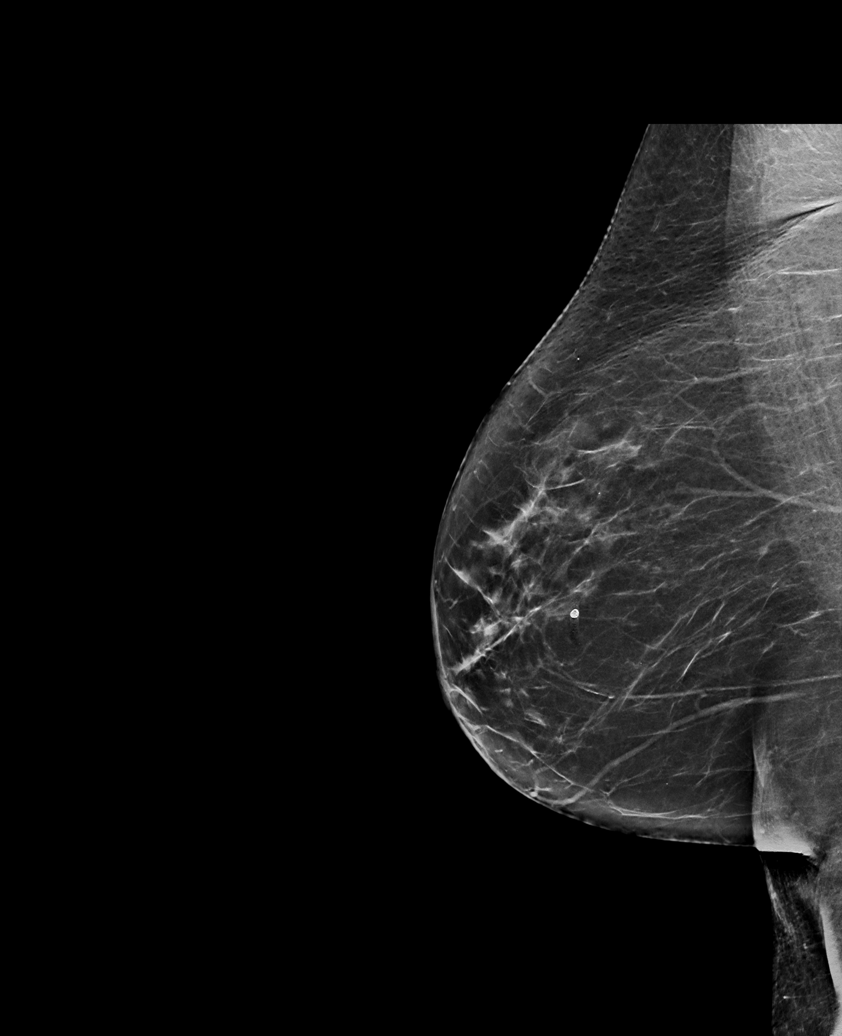

[L CC synth-2D]
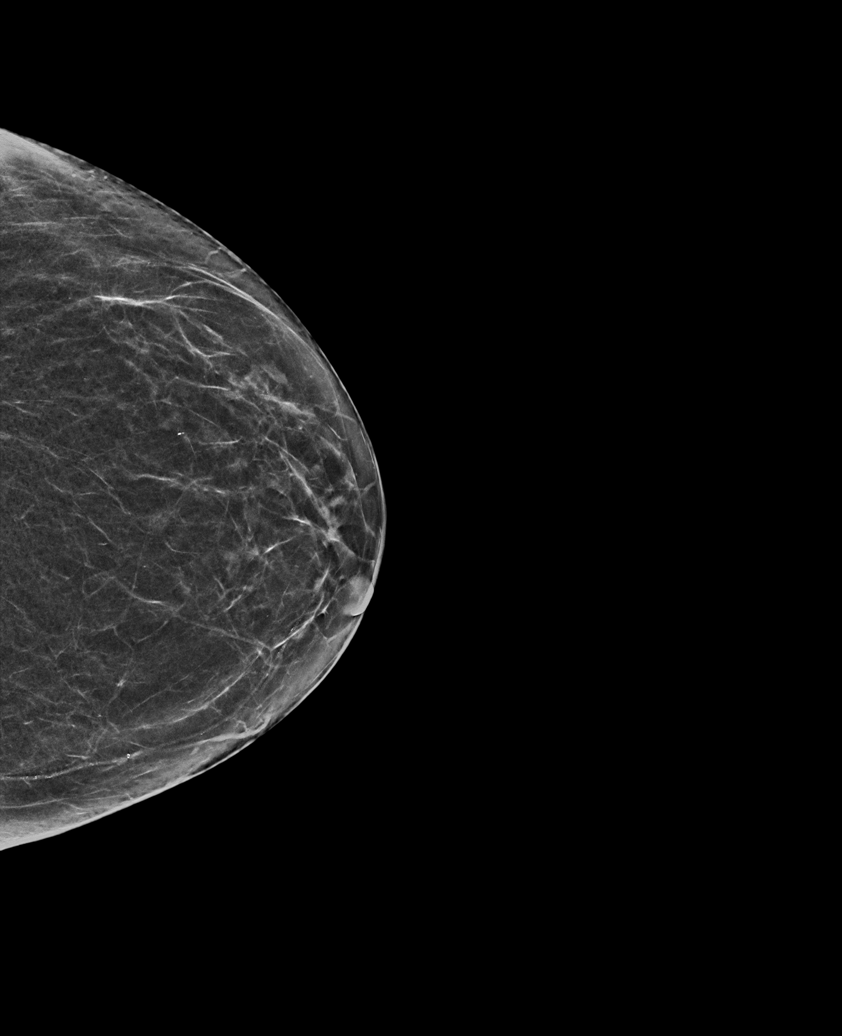

[L MLO tomo · tomo slice 33/65.0]
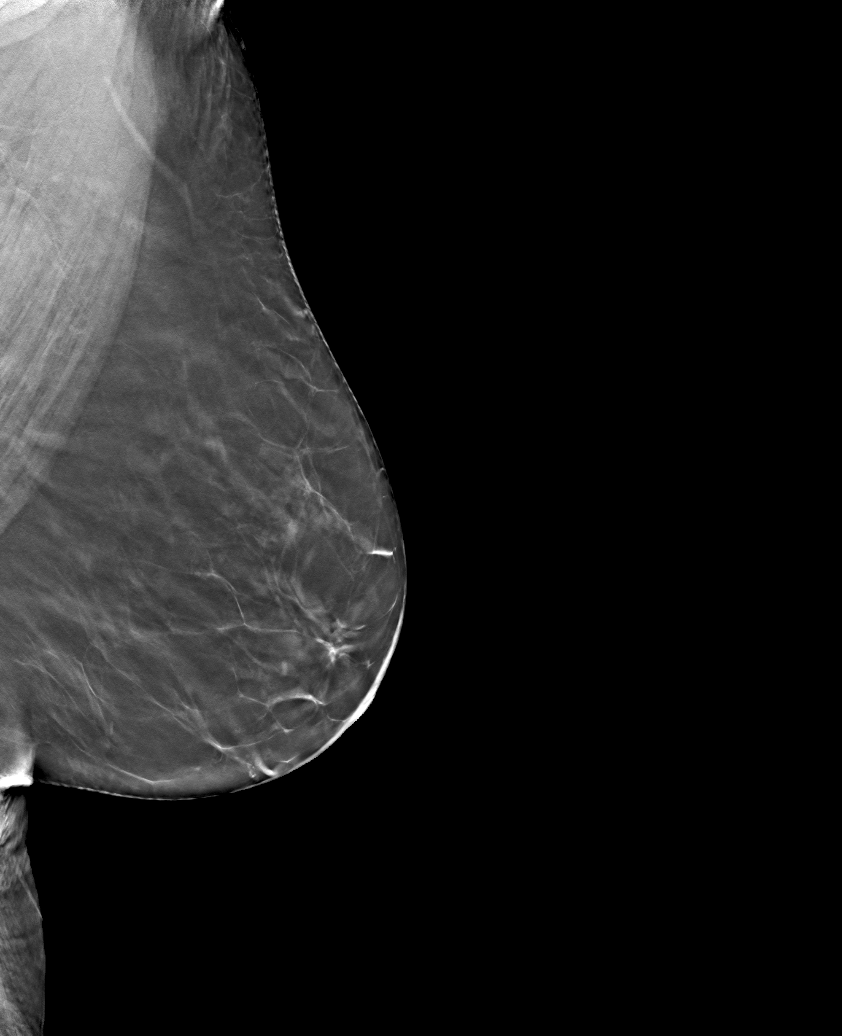

[6 of 30 positions shown; findings below may reference images not displayed]

ACR Breast Density Category b: There are scattered areas of
fibroglandular density.
FINDINGS: There are no findings suspicious for malignancy. The images were
evaluated with computer-aided detection.
IMPRESSION: No mammographic evidence of malignancy. A result letter of this
screening mammogram will be mailed directly to the patient.

RECOMMENDATION:
Screening mammogram in one year. (Code:[id number])

BI-RADS CATEGORY  1: Negative.

## 2022-07-16 DIAGNOSIS — M545 Low back pain, unspecified: Secondary | ICD-10-CM | POA: Diagnosis not present

## 2022-07-16 DIAGNOSIS — G4700 Insomnia, unspecified: Secondary | ICD-10-CM | POA: Diagnosis not present

## 2022-07-16 DIAGNOSIS — K219 Gastro-esophageal reflux disease without esophagitis: Secondary | ICD-10-CM | POA: Diagnosis not present

## 2022-07-16 DIAGNOSIS — Z96651 Presence of right artificial knee joint: Secondary | ICD-10-CM | POA: Diagnosis not present

## 2022-07-16 DIAGNOSIS — M4802 Spinal stenosis, cervical region: Secondary | ICD-10-CM | POA: Diagnosis not present

## 2022-07-31 ENCOUNTER — Encounter: Payer: Self-pay | Admitting: Orthopedic Surgery

## 2022-07-31 ENCOUNTER — Ambulatory Visit: Payer: Medicare HMO | Admitting: Orthopedic Surgery

## 2022-07-31 VITALS — Ht <= 58 in | Wt 118.2 lb

## 2022-07-31 DIAGNOSIS — M1712 Unilateral primary osteoarthritis, left knee: Secondary | ICD-10-CM | POA: Diagnosis not present

## 2022-07-31 DIAGNOSIS — G8929 Other chronic pain: Secondary | ICD-10-CM | POA: Diagnosis not present

## 2022-07-31 DIAGNOSIS — M545 Low back pain, unspecified: Secondary | ICD-10-CM | POA: Diagnosis not present

## 2022-07-31 DIAGNOSIS — M1711 Unilateral primary osteoarthritis, right knee: Secondary | ICD-10-CM

## 2022-07-31 DIAGNOSIS — Z96651 Presence of right artificial knee joint: Secondary | ICD-10-CM | POA: Diagnosis not present

## 2022-07-31 NOTE — Progress Notes (Signed)
Chief Complaint  Patient presents with   Routine Post Op    Rt TKR DOS 01/29/21 secondary wound closure 02/22/21 wound dehiscence    81 year old female thought to have some mild flexion instability comes in with continued lateral tenderness over the iliotibial band  No effusion.  She has lack of confidence when going up and down the stairs but she can squat and get up okay her radicular pain resolved after her bunion surgery   Examination reveals that she walks without support there is no limp there is no effusion in the knee she has an area of tenderness over the iliotibial band otherwise has good flexion extension of the joint with normal stability in extension trace positive anterior joint flexion  Encounter Diagnoses  Name Primary?   S/P TKR (total knee replacement), right Yes   Unilateral primary osteoarthritis, right knee    Chronic low back pain, unspecified back pain laterality, unspecified whether sciatica present    Primary osteoarthritis of left knee      Recommend repeat x-rays right and left knee when she returns in June

## 2022-09-17 DIAGNOSIS — E782 Mixed hyperlipidemia: Secondary | ICD-10-CM | POA: Diagnosis not present

## 2022-09-17 DIAGNOSIS — R7301 Impaired fasting glucose: Secondary | ICD-10-CM | POA: Diagnosis not present

## 2022-09-17 DIAGNOSIS — E039 Hypothyroidism, unspecified: Secondary | ICD-10-CM | POA: Diagnosis not present

## 2022-09-24 DIAGNOSIS — E039 Hypothyroidism, unspecified: Secondary | ICD-10-CM | POA: Diagnosis not present

## 2022-09-24 DIAGNOSIS — R42 Dizziness and giddiness: Secondary | ICD-10-CM | POA: Diagnosis not present

## 2022-09-24 DIAGNOSIS — M1990 Unspecified osteoarthritis, unspecified site: Secondary | ICD-10-CM | POA: Diagnosis not present

## 2022-09-24 DIAGNOSIS — I10 Essential (primary) hypertension: Secondary | ICD-10-CM | POA: Diagnosis not present

## 2022-09-24 DIAGNOSIS — N952 Postmenopausal atrophic vaginitis: Secondary | ICD-10-CM | POA: Diagnosis not present

## 2022-09-24 DIAGNOSIS — F331 Major depressive disorder, recurrent, moderate: Secondary | ICD-10-CM | POA: Diagnosis not present

## 2022-09-24 DIAGNOSIS — G4700 Insomnia, unspecified: Secondary | ICD-10-CM | POA: Diagnosis not present

## 2022-09-24 DIAGNOSIS — K219 Gastro-esophageal reflux disease without esophagitis: Secondary | ICD-10-CM | POA: Diagnosis not present

## 2022-09-24 DIAGNOSIS — E782 Mixed hyperlipidemia: Secondary | ICD-10-CM | POA: Diagnosis not present

## 2022-10-16 ENCOUNTER — Encounter: Payer: Self-pay | Admitting: Radiology

## 2022-11-17 ENCOUNTER — Other Ambulatory Visit (HOSPITAL_COMMUNITY): Payer: Self-pay | Admitting: Internal Medicine

## 2022-11-17 DIAGNOSIS — M8580 Other specified disorders of bone density and structure, unspecified site: Secondary | ICD-10-CM

## 2022-11-25 ENCOUNTER — Ambulatory Visit (HOSPITAL_COMMUNITY)
Admission: RE | Admit: 2022-11-25 | Discharge: 2022-11-25 | Disposition: A | Discharge status: Home / Self Care | Payer: Medicare HMO | Source: Ambulatory Visit | Attending: Internal Medicine | Admitting: Internal Medicine

## 2022-11-25 DIAGNOSIS — E0590 Thyrotoxicosis, unspecified without thyrotoxic crisis or storm: Secondary | ICD-10-CM | POA: Insufficient documentation

## 2022-11-25 DIAGNOSIS — M8580 Other specified disorders of bone density and structure, unspecified site: Secondary | ICD-10-CM

## 2022-11-25 DIAGNOSIS — Z13820 Encounter for screening for osteoporosis: Secondary | ICD-10-CM | POA: Insufficient documentation

## 2022-11-25 DIAGNOSIS — M85852 Other specified disorders of bone density and structure, left thigh: Secondary | ICD-10-CM | POA: Insufficient documentation

## 2022-11-25 DIAGNOSIS — Z780 Asymptomatic menopausal state: Secondary | ICD-10-CM | POA: Diagnosis not present

## 2022-12-05 DIAGNOSIS — Z713 Dietary counseling and surveillance: Secondary | ICD-10-CM | POA: Diagnosis not present

## 2022-12-05 DIAGNOSIS — Z87891 Personal history of nicotine dependence: Secondary | ICD-10-CM | POA: Diagnosis not present

## 2022-12-05 DIAGNOSIS — I10 Essential (primary) hypertension: Secondary | ICD-10-CM | POA: Diagnosis not present

## 2022-12-05 DIAGNOSIS — Z79899 Other long term (current) drug therapy: Secondary | ICD-10-CM | POA: Diagnosis not present

## 2022-12-05 DIAGNOSIS — Z7182 Exercise counseling: Secondary | ICD-10-CM | POA: Diagnosis not present

## 2022-12-05 DIAGNOSIS — Z6824 Body mass index (BMI) 24.0-24.9, adult: Secondary | ICD-10-CM | POA: Diagnosis not present

## 2022-12-05 DIAGNOSIS — M8580 Other specified disorders of bone density and structure, unspecified site: Secondary | ICD-10-CM | POA: Diagnosis not present

## 2022-12-05 DIAGNOSIS — R5383 Other fatigue: Secondary | ICD-10-CM | POA: Diagnosis not present

## 2022-12-11 ENCOUNTER — Ambulatory Visit: Payer: Medicare HMO | Admitting: Orthopedic Surgery

## 2022-12-11 ENCOUNTER — Encounter: Payer: Self-pay | Admitting: Orthopedic Surgery

## 2022-12-11 ENCOUNTER — Other Ambulatory Visit (INDEPENDENT_AMBULATORY_CARE_PROVIDER_SITE_OTHER): Payer: Medicare HMO

## 2022-12-11 ENCOUNTER — Other Ambulatory Visit: Payer: Self-pay

## 2022-12-11 DIAGNOSIS — M1712 Unilateral primary osteoarthritis, left knee: Secondary | ICD-10-CM | POA: Diagnosis not present

## 2022-12-11 DIAGNOSIS — Z96651 Presence of right artificial knee joint: Secondary | ICD-10-CM

## 2022-12-11 DIAGNOSIS — M1711 Unilateral primary osteoarthritis, right knee: Secondary | ICD-10-CM

## 2022-12-11 MED ORDER — METHYLPREDNISOLONE ACETATE 40 MG/ML IJ SUSP
40.0000 mg | Freq: Once | INTRAMUSCULAR | Status: AC
Start: 2022-12-11 — End: ?

## 2022-12-11 NOTE — Progress Notes (Signed)
   This is a follow-up appointment  Encounter Diagnoses  Name Primary?   S/P TKR (total knee replacement), right 01/29/21 secondary wound closure 02/22/21 wound dehiscence Yes   Unilateral primary osteoarthritis, right knee    Primary osteoarthritis of left knee      Chief Complaint  Patient presents with   Knee Problem    Right knee replacement 01/29/21 states knee feels hot put salve on it last night and better but states it is so hot that Ice pack melts very quickly on the right knee     Cindy Hopkins had an interesting event.  Yesterday her knee got really hot she noticed something on the medial side of the knee and she put some salve on it and it came out through the skin and then the warmth started to dissipate  She is also complaining of painful left knee with medial joint line pain which may or may not be brought on by dependence on the left knee because of difficulties she has had with the right  The knee is warm to touch but there is no erythema on the right.  She has full extension she has 125 degrees of flexion she has tilting of the patella  The remaining incision line looks normal.  There is no erythema around the incision line.  The left knee is tender on the medial joint line but has full range of motion without effusion strength and muscle tone are normal ligaments are stable  Imaging of both knees was obtained  The left knee does not really look too bad it is probably grade 2 arthritis  The right total knee tibiofemoral implant is in good position alignment without loosening.  There is some patella to the patella which as I discussed with her before would require revision of the component  However she has improved and the knee has defervesced so we will push her appointment out to December I do not think she needs any injections or surgery on the left knee at this time

## 2023-01-30 ENCOUNTER — Ambulatory Visit: Payer: Medicare HMO | Admitting: Orthopedic Surgery

## 2023-02-09 DIAGNOSIS — H5213 Myopia, bilateral: Secondary | ICD-10-CM | POA: Diagnosis not present

## 2023-02-09 DIAGNOSIS — Z0100 Encounter for examination of eyes and vision without abnormal findings: Secondary | ICD-10-CM | POA: Diagnosis not present

## 2023-03-19 DIAGNOSIS — E782 Mixed hyperlipidemia: Secondary | ICD-10-CM | POA: Diagnosis not present

## 2023-03-19 DIAGNOSIS — E039 Hypothyroidism, unspecified: Secondary | ICD-10-CM | POA: Diagnosis not present

## 2023-03-19 DIAGNOSIS — R7301 Impaired fasting glucose: Secondary | ICD-10-CM | POA: Diagnosis not present

## 2023-03-25 DIAGNOSIS — I129 Hypertensive chronic kidney disease with stage 1 through stage 4 chronic kidney disease, or unspecified chronic kidney disease: Secondary | ICD-10-CM | POA: Diagnosis not present

## 2023-03-25 DIAGNOSIS — M170 Bilateral primary osteoarthritis of knee: Secondary | ICD-10-CM | POA: Diagnosis not present

## 2023-03-25 DIAGNOSIS — K219 Gastro-esophageal reflux disease without esophagitis: Secondary | ICD-10-CM | POA: Diagnosis not present

## 2023-03-25 DIAGNOSIS — N1831 Chronic kidney disease, stage 3a: Secondary | ICD-10-CM | POA: Diagnosis not present

## 2023-03-25 DIAGNOSIS — E039 Hypothyroidism, unspecified: Secondary | ICD-10-CM | POA: Diagnosis not present

## 2023-03-25 DIAGNOSIS — Z124 Encounter for screening for malignant neoplasm of cervix: Secondary | ICD-10-CM | POA: Diagnosis not present

## 2023-03-25 DIAGNOSIS — I10 Essential (primary) hypertension: Secondary | ICD-10-CM | POA: Diagnosis not present

## 2023-03-25 DIAGNOSIS — M8580 Other specified disorders of bone density and structure, unspecified site: Secondary | ICD-10-CM | POA: Diagnosis not present

## 2023-03-25 DIAGNOSIS — E782 Mixed hyperlipidemia: Secondary | ICD-10-CM | POA: Diagnosis not present

## 2023-04-30 DIAGNOSIS — Z0000 Encounter for general adult medical examination without abnormal findings: Secondary | ICD-10-CM | POA: Diagnosis not present

## 2023-05-04 DIAGNOSIS — Z7189 Other specified counseling: Secondary | ICD-10-CM | POA: Diagnosis not present

## 2023-05-04 DIAGNOSIS — Z23 Encounter for immunization: Secondary | ICD-10-CM | POA: Diagnosis not present

## 2023-05-04 DIAGNOSIS — H6123 Impacted cerumen, bilateral: Secondary | ICD-10-CM | POA: Diagnosis not present

## 2023-05-06 ENCOUNTER — Encounter: Payer: Self-pay | Admitting: Adult Health

## 2023-05-06 ENCOUNTER — Ambulatory Visit: Payer: Medicare HMO | Admitting: Adult Health

## 2023-05-06 VITALS — BP 154/71 | HR 66 | Ht 58.5 in | Wt 120.5 lb

## 2023-05-06 DIAGNOSIS — I10 Essential (primary) hypertension: Secondary | ICD-10-CM | POA: Diagnosis not present

## 2023-05-06 DIAGNOSIS — R102 Pelvic and perineal pain: Secondary | ICD-10-CM | POA: Insufficient documentation

## 2023-05-06 DIAGNOSIS — N952 Postmenopausal atrophic vaginitis: Secondary | ICD-10-CM

## 2023-05-06 MED ORDER — PREMARIN 0.625 MG/GM VA CREA: TOPICAL_CREAM | VAGINAL | Status: AC

## 2023-05-06 NOTE — Progress Notes (Signed)
Subjective:     Patient ID: Cindy Hopkins., female   DOB: Jan 19, 1941, 82 y.o.   MRN: [id number]  HPI Cindy Hopkins is a 82 year old white female, widowed, sp hysterectomy in  because her PCP told her to come be seen, she has had pelvic pain L>R mainly with sitting or laying and thinks it may be scar tissue related to having 2 slings.  PCP is Dr Margo Aye  Review of Systems Has pelvic pain mainly with sitting or laying down Denies any problems with urination or bowel movement  She is not sexually active Reviewed past medical,surgical, social and family history. Reviewed medications and allergies.     Objective:   Physical Exam BP (!) 154/71 (BP Location: Right Arm, Patient Position: Sitting, Cuff Size: Normal)   Pulse 66   Ht 4' 10.5" (1.486 m)   Wt 120 lb 8 oz (54.7 kg)   BMI 24.76 kg/m     Skin warm and dry.Pelvic: external genitalia is normal in appearance no lesions, vagina: pale and atrophic,urethra has no lesions or masses noted, cervix and uterus are absent,adnexa: no masses or tenderness noted today, Bladder is non tender and no masses felt. Fall risk is moderate  [facility] - 05/06/23 0931       Pregnancy Intention Screening   Does the patient want to become pregnant in the next year? N/A    Does the patient's partner want to become pregnant in the next year? N/A    Would the patient like to discuss contraceptive options today? N/A      Contraception Wrap Up   Current Method Female Sterilization   hyst   End Method Female Sterilization   hyst   Contraception Counseling Provided No            Examination chaperoned by Malachy Mood LPN  Assessment:     1. Vaginal atrophy +atrophy, will try PVC Meds ordered this encounter  Medications   conjugated estrogens (PREMARIN) vaginal cream    Sig: Use pea sized amount on finger in vagina, nightly for 2 weeks them 2-3 x weekly    Order Specific Question:   Supervising Provider    Answer:   Duane Lope H [[id number]]     2. Pelvic  pain Has pelvic pain at times, L>R mainly with sitting or laying down No tenderness today,or masses felt   3. Hypertension, unspecified type Take BP meds  Follow up with PCP  Plan:     Follow up prn

## 2023-06-22 DIAGNOSIS — M170 Bilateral primary osteoarthritis of knee: Secondary | ICD-10-CM | POA: Diagnosis not present

## 2023-06-22 DIAGNOSIS — M2550 Pain in unspecified joint: Secondary | ICD-10-CM | POA: Diagnosis not present

## 2023-06-22 DIAGNOSIS — M25579 Pain in unspecified ankle and joints of unspecified foot: Secondary | ICD-10-CM | POA: Diagnosis not present

## 2023-08-03 ENCOUNTER — Ambulatory Visit: Payer: Medicare HMO | Admitting: Orthopedic Surgery

## 2023-08-27 DIAGNOSIS — E559 Vitamin D deficiency, unspecified: Secondary | ICD-10-CM | POA: Diagnosis not present

## 2023-08-27 DIAGNOSIS — M150 Primary generalized (osteo)arthritis: Secondary | ICD-10-CM | POA: Diagnosis not present

## 2023-08-27 DIAGNOSIS — M791 Myalgia, unspecified site: Secondary | ICD-10-CM | POA: Diagnosis not present

## 2023-09-21 DIAGNOSIS — E782 Mixed hyperlipidemia: Secondary | ICD-10-CM | POA: Diagnosis not present

## 2023-09-21 DIAGNOSIS — R7301 Impaired fasting glucose: Secondary | ICD-10-CM | POA: Diagnosis not present

## 2023-09-21 DIAGNOSIS — E039 Hypothyroidism, unspecified: Secondary | ICD-10-CM | POA: Diagnosis not present

## 2023-09-25 DIAGNOSIS — E782 Mixed hyperlipidemia: Secondary | ICD-10-CM | POA: Diagnosis not present

## 2023-09-25 DIAGNOSIS — I2510 Atherosclerotic heart disease of native coronary artery without angina pectoris: Secondary | ICD-10-CM | POA: Diagnosis not present

## 2023-09-25 DIAGNOSIS — N1831 Chronic kidney disease, stage 3a: Secondary | ICD-10-CM | POA: Diagnosis not present

## 2023-09-25 DIAGNOSIS — M170 Bilateral primary osteoarthritis of knee: Secondary | ICD-10-CM | POA: Diagnosis not present

## 2023-09-25 DIAGNOSIS — K219 Gastro-esophageal reflux disease without esophagitis: Secondary | ICD-10-CM | POA: Diagnosis not present

## 2023-09-25 DIAGNOSIS — I10 Essential (primary) hypertension: Secondary | ICD-10-CM | POA: Diagnosis not present

## 2023-09-25 DIAGNOSIS — E039 Hypothyroidism, unspecified: Secondary | ICD-10-CM | POA: Diagnosis not present

## 2023-09-25 DIAGNOSIS — M8580 Other specified disorders of bone density and structure, unspecified site: Secondary | ICD-10-CM | POA: Diagnosis not present

## 2023-09-25 DIAGNOSIS — M2550 Pain in unspecified joint: Secondary | ICD-10-CM | POA: Diagnosis not present

## 2023-11-30 DIAGNOSIS — M150 Primary generalized (osteo)arthritis: Secondary | ICD-10-CM | POA: Diagnosis not present

## 2023-11-30 DIAGNOSIS — M791 Myalgia, unspecified site: Secondary | ICD-10-CM | POA: Diagnosis not present

## 2024-02-09 ENCOUNTER — Emergency Department (HOSPITAL_COMMUNITY)

## 2024-02-09 ENCOUNTER — Emergency Department (HOSPITAL_COMMUNITY)
Admission: EM | Admit: 2024-02-09 | Discharge: 2024-02-09 | Disposition: A | Discharge status: Home / Self Care | Attending: Emergency Medicine | Admitting: Emergency Medicine

## 2024-02-09 ENCOUNTER — Other Ambulatory Visit: Payer: Self-pay

## 2024-02-09 ENCOUNTER — Encounter (HOSPITAL_COMMUNITY): Payer: Self-pay

## 2024-02-09 DIAGNOSIS — R531 Weakness: Secondary | ICD-10-CM | POA: Diagnosis not present

## 2024-02-09 DIAGNOSIS — R4182 Altered mental status, unspecified: Secondary | ICD-10-CM | POA: Insufficient documentation

## 2024-02-09 DIAGNOSIS — W19XXXA Unspecified fall, initial encounter: Secondary | ICD-10-CM

## 2024-02-09 DIAGNOSIS — I6523 Occlusion and stenosis of bilateral carotid arteries: Secondary | ICD-10-CM | POA: Diagnosis not present

## 2024-02-09 DIAGNOSIS — Z7982 Long term (current) use of aspirin: Secondary | ICD-10-CM | POA: Insufficient documentation

## 2024-02-09 DIAGNOSIS — M129 Arthropathy, unspecified: Secondary | ICD-10-CM | POA: Diagnosis not present

## 2024-02-09 DIAGNOSIS — W010XXA Fall on same level from slipping, tripping and stumbling without subsequent striking against object, initial encounter: Secondary | ICD-10-CM | POA: Insufficient documentation

## 2024-02-09 DIAGNOSIS — R42 Dizziness and giddiness: Secondary | ICD-10-CM | POA: Diagnosis not present

## 2024-02-09 DIAGNOSIS — M51369 Other intervertebral disc degeneration, lumbar region without mention of lumbar back pain or lower extremity pain: Secondary | ICD-10-CM | POA: Diagnosis not present

## 2024-02-09 DIAGNOSIS — I959 Hypotension, unspecified: Secondary | ICD-10-CM | POA: Diagnosis not present

## 2024-02-09 DIAGNOSIS — M47816 Spondylosis without myelopathy or radiculopathy, lumbar region: Secondary | ICD-10-CM | POA: Diagnosis not present

## 2024-02-09 LAB — URINALYSIS, ROUTINE W REFLEX MICROSCOPIC
Bacteria, UA: NONE SEEN
Bilirubin Urine: NEGATIVE
Glucose, UA: NEGATIVE mg/dL
Hgb urine dipstick: NEGATIVE
Ketones, ur: NEGATIVE mg/dL
Leukocytes,Ua: NEGATIVE
Nitrite: NEGATIVE
Protein, ur: 30 mg/dL — AB
Specific Gravity, Urine: 1.019 (ref 1.005–1.030)
pH: 5 (ref 5.0–8.0)

## 2024-02-09 LAB — TROPONIN I (HIGH SENSITIVITY)
Troponin I (High Sensitivity): <2 ng/L (ref <18)
Troponin I (High Sensitivity): 2 ng/L (ref <18)

## 2024-02-09 LAB — COMPREHENSIVE METABOLIC PANEL WITH GFR
ALT: 15 U/L (ref 0–44)
AST: 28 U/L (ref 15–41)
Albumin: 3.5 g/dL (ref 3.5–5.0)
Alkaline Phosphatase: 102 U/L (ref 38–126)
Anion gap: 10 (ref 5–15)
BUN: 23 mg/dL (ref 8–23)
CO2: 22 mmol/L (ref 22–32)
Calcium: 9.1 mg/dL (ref 8.9–10.3)
Chloride: 102 mmol/L (ref 98–111)
Creatinine, Ser: 1 mg/dL (ref 0.44–1.00)
GFR, Estimated: 57 mL/min — ABNORMAL LOW (ref >60)
Glucose, Bld: 104 mg/dL — ABNORMAL HIGH (ref 70–99)
Potassium: 4.6 mmol/L (ref 3.5–5.1)
Sodium: 134 mmol/L — ABNORMAL LOW (ref 135–145)
Total Bilirubin: 0.5 mg/dL (ref 0.0–1.2)
Total Protein: 6.9 g/dL (ref 6.5–8.1)

## 2024-02-09 LAB — CBC
HCT: 34.1 % — ABNORMAL LOW (ref 36.0–46.0)
Hemoglobin: 10.5 g/dL — ABNORMAL LOW (ref 12.0–15.0)
MCH: 27 pg (ref 26.0–34.0)
MCHC: 30.8 g/dL (ref 30.0–36.0)
MCV: 87.7 fL (ref 80.0–100.0)
Platelets: 317 10*3/uL (ref 150–400)
RBC: 3.89 MIL/uL (ref 3.87–5.11)
RDW: 14.2 % (ref 11.5–15.5)
WBC: 10.7 10*3/uL — ABNORMAL HIGH (ref 4.0–10.5)
nRBC: 0 % (ref 0.0–0.2)

## 2024-02-09 LAB — CBG MONITORING, ED: Glucose-Capillary: 98 mg/dL (ref 70–99)

## 2024-02-09 NOTE — ED Triage Notes (Signed)
 Pt bib ems from home for AMS, weakness, dizziness, Pt found on floor by neighbor approximately down for 4 hours. Pt states her bottom was heavy and she didn't have the strength to get up. Denies hitting head, but states that she may have dosed off when on the ground. AAOx4, and ambulatory with cane at baseline.

## 2024-02-09 NOTE — Discharge Instructions (Signed)
 Use a walker always to ambulate with.  Follow-up with your family doctor next week.  Take Tylenol  for pain

## 2024-02-09 NOTE — ED Notes (Signed)
 Updated patients daughter and son on patients status.

## 2024-02-11 NOTE — ED Provider Notes (Signed)
 [place] EMERGENCY DEPARTMENT AT [facility] Provider Note   CSN: [id number] Arrival date & time: 02/09/24  1144     Patient presents with: Altered Mental Status   Zakari Couchman. is a 83 y.o. female.   Patient states that she slipped down on the floor in her bedroom and was too weak to get up.  Patient has a history of obesity and knee problems.  The history is provided by the patient and medical records. No language interpreter was used.  Fall This is a new problem. The current episode started 6 to 12 hours ago. The problem occurs rarely. The problem has been resolved. Pertinent negatives include no chest pain, no abdominal pain and no headaches. The symptoms are aggravated by walking. Nothing relieves the symptoms.       Prior to Admission medications   Medication Sig Start Date End Date Taking? Authorizing Provider  acetaminophen  (TYLENOL ) 500 MG tablet Take 1,500 mg by mouth every 6 (six) hours as needed for moderate pain (pain score 4-6).   Yes [provider]  acetaminophen  (TYLENOL ) 650 MG CR tablet Take 1,300 mg by mouth every 8 (eight) hours as needed for pain.   Yes [provider]  atenolol  (TENORMIN ) 50 MG tablet Take 50 mg by mouth daily.    Yes [provider]  atorvastatin  (LIPITOR) 20 MG tablet Take 20 mg by mouth daily. 07/24/22  Yes [provider]  buPROPion (WELLBUTRIN XL) 150 MG 24 hr tablet Take 150 mg by mouth daily. 06/26/21  Yes [provider]  Calcium  Carbonate-Vit D-Min (CALCIUM  1200 PO) Take 1,200 mg by mouth.   Yes [provider]  celecoxib  (CELEBREX ) 200 MG capsule Take by mouth 2 (two) times daily.   Yes [provider]  Cholecalciferol  (VITAMIN D ) 50 MCG (2000 UT) tablet Take 2,000 Units by mouth daily.   Yes [provider]  docusate sodium  (COLACE) 100 MG capsule Take 1 capsule (100 mg total) by mouth 2 (two) times daily. 01/30/21  Yes Margrette Taft BRAVO, MD   escitalopram  (LEXAPRO ) 20 MG tablet Take 20 mg by mouth daily. 04/03/23  Yes [provider]  fluticasone  (FLONASE ) 50 MCG/ACT nasal spray SPRAY 2 SPRAYS INTO EACH NOSTRIL TWICE DAILY FOR 7 DAYS; THEN USE ONCE DAILY AS NEEDED. Patient taking differently: Place 1 spray into both nostrils daily as needed for allergies. 10/05/18  Yes Karis Clunes, MD  gabapentin (NEURONTIN) 100 MG capsule Take 100 mg by mouth daily.   Yes [provider]  levocetirizine (XYZAL) 5 MG tablet Take 5 mg by mouth daily. 01/14/23  Yes [provider]  lisinopril  (ZESTRIL ) 20 MG tablet Take 20 mg by mouth daily. 02/07/19  Yes [provider]  MAGNESIUM PO Take by mouth.   Yes [provider]  methimazole  (TAPAZOLE ) 5 MG tablet Take 5-10 mg by mouth See admin instructions. Take 5 mg by mouth on Monday, Wednesday and Friday on Saturday take 10 mg   Yes [provider]  mineral oil-hydrophilic petrolatum (AQUAPHOR) ointment Apply topically.   Yes [provider]  Multiple Vitamin (MULTI VITAMIN PO) Take by mouth.   Yes [provider]  pantoprazole  (PROTONIX ) 40 MG tablet Take 40 mg by mouth daily. 04/03/21  Yes [provider]  zolpidem  (AMBIEN ) 10 MG tablet Take 10 mg by mouth at bedtime. 06/26/21  Yes [provider]  aspirin  EC 81 MG tablet Take 81 mg by mouth daily. Swallow whole.  [provider]  conjugated estrogens  (PREMARIN ) vaginal cream Use pea sized amount on finger in vagina, nightly for 2 weeks them 2-3 x weekly Patient not taking: Reported on 02/09/2024 05/06/23   Signa Delon LABOR, NP    Allergies: Patient has no known allergies.    Review of Systems  Constitutional:  Negative for appetite change and fatigue.  HENT:  Negative for congestion, ear discharge and sinus pressure.   Eyes:  Negative for discharge.  Respiratory:  Negative for cough.   Cardiovascular:  Negative for chest pain.  Gastrointestinal:  Negative  for abdominal pain and diarrhea.  Genitourinary:  Negative for frequency and hematuria.  Musculoskeletal:  Negative for back pain.  Skin:  Negative for rash.  Neurological:  Positive for weakness. Negative for seizures and headaches.  Psychiatric/Behavioral:  Negative for hallucinations.     Updated Vital Signs BP 131/63   Pulse 74   Temp 98.4 F (36.9 C) (Oral)   Resp (!) 25   Ht 4' 10 (1.473 m)   Wt 58 kg   SpO2 97%   BMI 26.72 kg/m   Physical Exam Vitals and nursing note reviewed.  Constitutional:      Appearance: She is well-developed.  HENT:     Head: Normocephalic.     Right Ear: External ear normal.     Nose: Nose normal.   Eyes:     General: No scleral icterus.    Conjunctiva/sclera: Conjunctivae normal.   Neck:     Thyroid : No thyromegaly.   Cardiovascular:     Rate and Rhythm: Normal rate and regular rhythm.     Heart sounds: No murmur heard.    No friction rub. No gallop.  Pulmonary:     Breath sounds: No stridor. No wheezing or rales.  Chest:     Chest wall: No tenderness.  Abdominal:     General: There is no distension.     Tenderness: There is no abdominal tenderness. There is no rebound.   Musculoskeletal:        General: Normal range of motion.     Cervical back: Neck supple.     Comments: Patient ambulated without difficulty  Lymphadenopathy:     Cervical: No cervical adenopathy.   Skin:    Findings: No erythema or rash.   Neurological:     Mental Status: She is alert and oriented to person, place, and time.     Motor: No abnormal muscle tone.     Coordination: Coordination normal.   Psychiatric:        Behavior: Behavior normal.     (all labs ordered are listed, but only abnormal results are displayed) Labs Reviewed  COMPREHENSIVE METABOLIC PANEL WITH GFR - Abnormal; Notable for the following components:      Result Value   Sodium 134 (*)    Glucose, Bld 104 (*)    GFR, Estimated 57 (*)    All other components within normal  limits  CBC - Abnormal; Notable for the following components:   WBC 10.7 (*)    Hemoglobin 10.5 (*)    HCT 34.1 (*)    All other components within normal limits  URINALYSIS, ROUTINE W REFLEX MICROSCOPIC - Abnormal; Notable for the following components:   Protein, ur 30 (*)    All other components within normal limits  CBG MONITORING, ED  TROPONIN I (HIGH SENSITIVITY)  TROPONIN I (HIGH SENSITIVITY)    EKG: None  Radiology: CT Head Wo Contrast Result Date: 02/09/2024 CLINICAL  DATA:  Syncope/presyncope, cerebrovascular cause suspected EXAM: CT HEAD WITHOUT CONTRAST TECHNIQUE: Contiguous axial images were obtained from the base of the skull through the vertex without intravenous contrast. RADIATION DOSE REDUCTION: This exam was performed according to the departmental dose-optimization program which includes automated exposure control, adjustment of the mA and/or kV according to patient size and/or use of iterative reconstruction technique. COMPARISON:  None Available. FINDINGS: Brain: Proportional prominence of the ventricles and sulci, consistent with diffuse cerebral parenchymal volume loss. The ventricles otherwise maintained midline position without midline shift. Gray-white differentiation is preserved.Periventricular and subcortical white matter hypoattenuation, most consistent with changes of moderate chronic ischemic microvascular disease.No evidence of acute territorial infarction, extra-axial fluid collection, hemorrhage, or mass lesion. The basilar cisterns are patent without downward herniation. The cerebellar hemispheres and vermis are well formed without mass lesion or focal attenuation abnormality. Vascular: No hyperdense vessel. Calcified atherosclerotic plaque within the cavernous/supraclinoid internal carotid arteries. Skull: Normal. Negative for fracture or focal lesion. Sinuses/Orbits: The paranasal sinuses and mastoids are clear.The globes appear intact. No retrobulbar  hematoma.Bilateral lens replacements. Other: None. IMPRESSION: 1. No acute intracranial abnormality, specifically, no acute hemorrhage, territorial infarction, or intracranial mass. 2. Mild global cerebral volume loss with sequelae of chronic ischemic microvascular disease. Electronically Signed   By: Rogelia Myers M.D.   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 No disruption of the arcuate lines identified. No pelvic fracture observed. Minimal spurring of the acetabula. Small anchors likely related to prior bladder tack procedure. IMPRESSION: 1. No pelvic fracture identified. 2. 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 Electronically Signed   By: Ryan Salvage M.D.   On: 02/09/2024 14:01   DG Chest Port 1 View Result Date: 02/09/2024 CLINICAL DATA:  Altered mental status, weakness, dizziness, fall EXAM: PORTABLE CHEST 1 VIEW COMPARISON:  None Available. FINDINGS: Degenerative glenohumeral arthropathy bilaterally. Mild atheromatous vascular calcification of the thoracic aorta. Upper normal heart size. The lungs appear clear. No blunting of the costophrenic angles. IMPRESSION: 1. No acute findings. 2. Upper normal heart size. 3. Mild atheromatous vascular calcification of the thoracic aorta. Aortic Atherosclerosis (ICD10-I70.0). 4. Degenerative glenohumeral arthropathy bilaterally. Electronically Signed   By: Ryan Salvage M.D.   On: 02/09/2024 13:59     Procedures   Medications Ordered in the ED - No data to display Patient had a fall with no injuries.  Chest x-ray pelvis x-ray CT head all unremarkable CBC and chemistries and UA negative.  EKG shows no acute changes.  Patient ambulating without problems.                                 Medical Decision Making Amount and/or Complexity of  Data Reviewed Labs: ordered. Radiology: ordered.   Patient with weakness after fall.  Patient was unable to get up.  Patient ambulating without problems now with normal labs and x-rays.  She is discharged home to follow-up with her PCP and will use a walker to ambulate with them down     Final diagnoses:  Fall, initial encounter  Weakness    ED Discharge Orders     None          Suzette Pac, MD 02/11/24 1112

## 2024-02-15 DIAGNOSIS — M25511 Pain in right shoulder: Secondary | ICD-10-CM | POA: Diagnosis not present

## 2024-02-15 DIAGNOSIS — Z09 Encounter for follow-up examination after completed treatment for conditions other than malignant neoplasm: Secondary | ICD-10-CM | POA: Diagnosis not present

## 2024-02-15 DIAGNOSIS — W06XXXA Fall from bed, initial encounter: Secondary | ICD-10-CM | POA: Diagnosis not present

## 2024-02-15 DIAGNOSIS — S300XXA Contusion of lower back and pelvis, initial encounter: Secondary | ICD-10-CM | POA: Diagnosis not present

## 2024-02-15 DIAGNOSIS — Z9989 Dependence on other enabling machines and devices: Secondary | ICD-10-CM | POA: Diagnosis not present

## 2024-03-01 DIAGNOSIS — H524 Presbyopia: Secondary | ICD-10-CM | POA: Diagnosis not present

## 2024-03-18 DIAGNOSIS — E039 Hypothyroidism, unspecified: Secondary | ICD-10-CM | POA: Diagnosis not present

## 2024-03-18 DIAGNOSIS — R7301 Impaired fasting glucose: Secondary | ICD-10-CM | POA: Diagnosis not present

## 2024-03-18 DIAGNOSIS — E782 Mixed hyperlipidemia: Secondary | ICD-10-CM | POA: Diagnosis not present

## 2024-03-24 DIAGNOSIS — E039 Hypothyroidism, unspecified: Secondary | ICD-10-CM | POA: Diagnosis not present

## 2024-03-24 DIAGNOSIS — E875 Hyperkalemia: Secondary | ICD-10-CM | POA: Diagnosis not present

## 2024-03-24 DIAGNOSIS — I129 Hypertensive chronic kidney disease with stage 1 through stage 4 chronic kidney disease, or unspecified chronic kidney disease: Secondary | ICD-10-CM | POA: Diagnosis not present

## 2024-03-24 DIAGNOSIS — D649 Anemia, unspecified: Secondary | ICD-10-CM | POA: Diagnosis not present

## 2024-03-24 DIAGNOSIS — H6123 Impacted cerumen, bilateral: Secondary | ICD-10-CM | POA: Diagnosis not present

## 2024-03-24 DIAGNOSIS — M8580 Other specified disorders of bone density and structure, unspecified site: Secondary | ICD-10-CM | POA: Diagnosis not present

## 2024-03-24 DIAGNOSIS — N1831 Chronic kidney disease, stage 3a: Secondary | ICD-10-CM | POA: Diagnosis not present

## 2024-03-24 DIAGNOSIS — E782 Mixed hyperlipidemia: Secondary | ICD-10-CM | POA: Diagnosis not present

## 2024-03-24 DIAGNOSIS — I10 Essential (primary) hypertension: Secondary | ICD-10-CM | POA: Diagnosis not present

## 2024-04-21 DIAGNOSIS — M545 Low back pain, unspecified: Secondary | ICD-10-CM | POA: Diagnosis not present

## 2024-04-21 DIAGNOSIS — N1831 Chronic kidney disease, stage 3a: Secondary | ICD-10-CM | POA: Diagnosis not present

## 2024-04-21 DIAGNOSIS — Z79899 Other long term (current) drug therapy: Secondary | ICD-10-CM | POA: Diagnosis not present

## 2024-04-21 DIAGNOSIS — Z23 Encounter for immunization: Secondary | ICD-10-CM | POA: Diagnosis not present

## 2024-04-21 DIAGNOSIS — I10 Essential (primary) hypertension: Secondary | ICD-10-CM | POA: Diagnosis not present

## 2024-04-21 DIAGNOSIS — M170 Bilateral primary osteoarthritis of knee: Secondary | ICD-10-CM | POA: Diagnosis not present

## 2024-04-21 DIAGNOSIS — G8929 Other chronic pain: Secondary | ICD-10-CM | POA: Diagnosis not present

## 2024-04-21 DIAGNOSIS — I129 Hypertensive chronic kidney disease with stage 1 through stage 4 chronic kidney disease, or unspecified chronic kidney disease: Secondary | ICD-10-CM | POA: Diagnosis not present

## 2024-04-21 DIAGNOSIS — D649 Anemia, unspecified: Secondary | ICD-10-CM | POA: Diagnosis not present

## 2024-05-10 DIAGNOSIS — M791 Myalgia, unspecified site: Secondary | ICD-10-CM | POA: Diagnosis not present

## 2024-05-10 DIAGNOSIS — M150 Primary generalized (osteo)arthritis: Secondary | ICD-10-CM | POA: Diagnosis not present

## 2024-05-10 DIAGNOSIS — M47816 Spondylosis without myelopathy or radiculopathy, lumbar region: Secondary | ICD-10-CM | POA: Diagnosis not present
